# Patient Record
Sex: Female | Born: 1945 | Race: White | Hispanic: No | Smoking: Former smoker
Health system: Southern US, Community
[De-identification: ages and names within clinical notes are randomized; demographics above are authoritative.]

## PROBLEM LIST (undated history)

## (undated) DIAGNOSIS — R413 Other amnesia: Secondary | ICD-10-CM

## (undated) DIAGNOSIS — M810 Age-related osteoporosis without current pathological fracture: Secondary | ICD-10-CM

## (undated) DIAGNOSIS — Z9289 Personal history of other medical treatment: Secondary | ICD-10-CM

## (undated) DIAGNOSIS — R0602 Shortness of breath: Secondary | ICD-10-CM

## (undated) DIAGNOSIS — J449 Chronic obstructive pulmonary disease, unspecified: Secondary | ICD-10-CM

## (undated) DIAGNOSIS — E7800 Pure hypercholesterolemia, unspecified: Secondary | ICD-10-CM

## (undated) DIAGNOSIS — F17200 Nicotine dependence, unspecified, uncomplicated: Secondary | ICD-10-CM

## (undated) DIAGNOSIS — M1990 Unspecified osteoarthritis, unspecified site: Secondary | ICD-10-CM

## (undated) DIAGNOSIS — T8859XA Other complications of anesthesia, initial encounter: Secondary | ICD-10-CM

## (undated) DIAGNOSIS — Z923 Personal history of irradiation: Secondary | ICD-10-CM

## (undated) HISTORY — PX: ABDOMINAL HYSTERECTOMY: SHX81

## (undated) HISTORY — DX: Personal history of irradiation: Z92.3

## (undated) HISTORY — PX: EYE SURGERY: SHX253

## (undated) HISTORY — PX: APPENDECTOMY: SHX54

## (undated) HISTORY — PX: COLONOSCOPY W/ POLYPECTOMY: SHX1380

---

## 1999-01-30 ENCOUNTER — Ambulatory Visit (HOSPITAL_COMMUNITY): Admission: RE | Admit: 1999-01-30 | Discharge: 1999-01-30 | Payer: Self-pay | Admitting: Gastroenterology

## 2000-02-14 ENCOUNTER — Encounter: Payer: Self-pay | Admitting: Family Medicine

## 2000-02-14 ENCOUNTER — Encounter: Admission: RE | Admit: 2000-02-14 | Discharge: 2000-02-14 | Payer: Self-pay | Admitting: Family Medicine

## 2001-03-25 ENCOUNTER — Encounter: Admission: RE | Admit: 2001-03-25 | Discharge: 2001-03-25 | Payer: Self-pay | Admitting: Family Medicine

## 2001-03-25 ENCOUNTER — Encounter: Payer: Self-pay | Admitting: Family Medicine

## 2002-06-11 ENCOUNTER — Encounter: Payer: Self-pay | Admitting: Family Medicine

## 2002-06-11 ENCOUNTER — Encounter: Admission: RE | Admit: 2002-06-11 | Discharge: 2002-06-11 | Payer: Self-pay | Admitting: Family Medicine

## 2003-05-11 ENCOUNTER — Other Ambulatory Visit: Admission: RE | Admit: 2003-05-11 | Discharge: 2003-05-11 | Payer: Self-pay | Admitting: Family Medicine

## 2003-06-21 ENCOUNTER — Encounter: Payer: Self-pay | Admitting: Family Medicine

## 2003-06-21 ENCOUNTER — Encounter: Admission: RE | Admit: 2003-06-21 | Discharge: 2003-06-21 | Payer: Self-pay | Admitting: Family Medicine

## 2003-06-24 ENCOUNTER — Encounter: Admission: RE | Admit: 2003-06-24 | Discharge: 2003-06-24 | Payer: Self-pay | Admitting: Family Medicine

## 2003-06-24 ENCOUNTER — Encounter: Payer: Self-pay | Admitting: Family Medicine

## 2004-05-02 ENCOUNTER — Ambulatory Visit (HOSPITAL_COMMUNITY): Admission: RE | Admit: 2004-05-02 | Discharge: 2004-05-02 | Payer: Self-pay | Admitting: Gastroenterology

## 2004-05-02 ENCOUNTER — Encounter (INDEPENDENT_AMBULATORY_CARE_PROVIDER_SITE_OTHER): Payer: Self-pay | Admitting: *Deleted

## 2004-10-31 ENCOUNTER — Encounter: Admission: RE | Admit: 2004-10-31 | Discharge: 2004-10-31 | Payer: Self-pay | Admitting: Family Medicine

## 2006-01-09 ENCOUNTER — Encounter: Admission: RE | Admit: 2006-01-09 | Discharge: 2006-01-09 | Payer: Self-pay | Admitting: Family Medicine

## 2007-02-20 ENCOUNTER — Encounter: Admission: RE | Admit: 2007-02-20 | Discharge: 2007-02-20 | Payer: Self-pay | Admitting: Family Medicine

## 2007-03-31 ENCOUNTER — Encounter: Admission: RE | Admit: 2007-03-31 | Discharge: 2007-03-31 | Payer: Self-pay | Admitting: Family Medicine

## 2008-07-28 ENCOUNTER — Encounter: Admission: RE | Admit: 2008-07-28 | Discharge: 2008-07-28 | Payer: Self-pay | Admitting: Family Medicine

## 2009-05-30 ENCOUNTER — Encounter: Admission: RE | Admit: 2009-05-30 | Discharge: 2009-05-30 | Payer: Self-pay | Admitting: Family Medicine

## 2010-03-16 ENCOUNTER — Encounter: Admission: RE | Admit: 2010-03-16 | Discharge: 2010-03-16 | Payer: Self-pay | Admitting: Family Medicine

## 2010-09-10 ENCOUNTER — Encounter: Admission: RE | Admit: 2010-09-10 | Discharge: 2010-09-10 | Payer: Self-pay | Admitting: Family Medicine

## 2011-03-29 NOTE — Op Note (Signed)
NAMEWING, GFELLER                          ACCOUNT NO.:  [id number]   MEDICAL RECORD NO.:  [id number]                   PATIENT TYPE:  AMB   LOCATION:  ENDO                                 FACILITY:  [facility]   PHYSICIAN:  Petra Kuba, M.D.                 DATE OF BIRTH:  1946/03/16   DATE OF PROCEDURE:  05/02/2004  DATE OF DISCHARGE:                                 OPERATIVE REPORT   PROCEDURE:  Colonoscopy with polypectomy.   INDICATIONS FOR PROCEDURE:  A patient with a history of colon polyps due for  repeat screening.   Consent was signed after risks, benefits, methods, and options were  thoroughly discussed multiple times in the past.   MEDICINES USED:  Demerol 70, Versed 7.   DESCRIPTION OF PROCEDURE:  Rectal inspection was pertinent for small  external hemorrhoids. Digital exam was negative. The pediatric video  adjustable colonoscope was inserted and despite a tortous sigmoid with  rolling her on her back and abdominal pressure was able to be advanced to  the cecum.  No obvious abnormality was seen on insertion. The cecum was  identified by the appendiceal orifice and the ileocecal valve, the scope was  slowly withdrawn. The prep was adequate. There was some liquid tool that  required washing and suctioning  but on slow withdrawal through the colon  other than a tiny rectal polyp which was hot biopsied on retroflexion and  some small internal hemorrhoids on retroflexion no abnormalities were seen.  Specifically no other polyps, tumors, masses or diverticula.  Anal rectal  pullthrough confirmed the hemorrhoids.  The scope was reinserted a short  ways up the left side of the colon, air and water were suctioned, scope  removed. The patient tolerated the procedure well. There was no obvious or  immediate complications.   ENDOSCOPIC DIAGNOSIS:  1. Internal and external hemorrhoids.  2. Tiny rectal polyp hot biopsied on retroflexion.  3. Tortous sigmoid.  4. Otherwise  within normal limits to the cecum.   PLAN:  Await pathology but probably recheck colon screening in five years.  Happy to see back sooner p.r.n. otherwise return care to Dr. Clarene Duke for the  customary health care maintenance to include yearly rectals and guaiacs.                                               Petra Kuba, M.D.    MEM/MEDQ  D:  05/02/2004  T:  05/02/2004  Job:  [id number]   cc:   Caryn Bee L. Little, M.D.  [address]  [place]  [place] [address]  Fax: [phone]

## 2011-04-10 ENCOUNTER — Other Ambulatory Visit: Payer: Self-pay | Admitting: Family Medicine

## 2011-04-10 DIAGNOSIS — Z1231 Encounter for screening mammogram for malignant neoplasm of breast: Secondary | ICD-10-CM

## 2011-04-17 ENCOUNTER — Ambulatory Visit
Admission: RE | Admit: 2011-04-17 | Discharge: 2011-04-17 | Disposition: A | Discharge status: Home / Self Care | Payer: Medicare Other | Source: Ambulatory Visit | Attending: Family Medicine | Admitting: Family Medicine

## 2011-04-17 DIAGNOSIS — Z1231 Encounter for screening mammogram for malignant neoplasm of breast: Secondary | ICD-10-CM

## 2011-09-10 ENCOUNTER — Other Ambulatory Visit: Payer: Self-pay | Admitting: Family Medicine

## 2011-09-10 DIAGNOSIS — F17200 Nicotine dependence, unspecified, uncomplicated: Secondary | ICD-10-CM

## 2012-06-08 ENCOUNTER — Other Ambulatory Visit: Payer: Self-pay | Admitting: Family Medicine

## 2012-06-08 DIAGNOSIS — Z1231 Encounter for screening mammogram for malignant neoplasm of breast: Secondary | ICD-10-CM

## 2012-07-03 ENCOUNTER — Ambulatory Visit
Admission: RE | Admit: 2012-07-03 | Discharge: 2012-07-03 | Disposition: A | Discharge status: Home / Self Care | Payer: Medicare Other | Source: Ambulatory Visit | Attending: Family Medicine | Admitting: Family Medicine

## 2012-07-03 DIAGNOSIS — Z1231 Encounter for screening mammogram for malignant neoplasm of breast: Secondary | ICD-10-CM

## 2012-08-26 ENCOUNTER — Other Ambulatory Visit: Payer: Self-pay | Admitting: Neurosurgery

## 2012-08-26 DIAGNOSIS — IMO0002 Reserved for concepts with insufficient information to code with codable children: Secondary | ICD-10-CM

## 2012-09-02 ENCOUNTER — Ambulatory Visit
Admission: RE | Admit: 2012-09-02 | Discharge: 2012-09-02 | Disposition: A | Discharge status: Home / Self Care | Payer: Medicare Other | Source: Ambulatory Visit | Attending: Neurosurgery | Admitting: Neurosurgery

## 2012-09-02 DIAGNOSIS — IMO0002 Reserved for concepts with insufficient information to code with codable children: Secondary | ICD-10-CM

## 2012-09-09 ENCOUNTER — Other Ambulatory Visit: Payer: Self-pay | Admitting: Neurosurgery

## 2012-09-15 ENCOUNTER — Encounter (HOSPITAL_COMMUNITY): Payer: Self-pay | Admitting: Pharmacy Technician

## 2012-09-21 NOTE — Pre-Procedure Instructions (Signed)
20 Virginia Rose  09/21/2012   Your procedure is scheduled on:  Monday, November 18th  Report to [facility] at 0530 AM.  Call this number if you have problems the morning of surgery: [phone]   Remember:   Do not eat food or drink:After Midnight.   Take these medicines the morning of surgery with A SIP OF WATER: eye drops   Do not wear jewelry, make-up or nail polish.  Do not wear lotions, powders, or perfumes.    Do not shave 48 hours prior to surgery.    Do not bring valuables to the hospital.  Contacts, dentures or bridgework may not be worn into surgery.  Leave suitcase in the car. After surgery it may be brought to your room.  For patients admitted to the hospital, checkout time is 11:00 AM the day of discharge.    Special Instructions: Shower using CHG 2 nights before surgery and the night before surgery.  If you shower the day of surgery use CHG.  Use special wash - you have one bottle of CHG for all showers.  You should use approximately 1/3 of the bottle for each shower.   Please read over the following fact sheets that you were given: Pain Booklet, Coughing and Deep Breathing, Blood Transfusion Information, MRSA Information and Surgical Site Infection Prevention

## 2012-09-22 ENCOUNTER — Encounter (HOSPITAL_COMMUNITY): Payer: Self-pay

## 2012-09-22 ENCOUNTER — Encounter (HOSPITAL_COMMUNITY)
Admission: RE | Admit: 2012-09-22 | Discharge: 2012-09-22 | Disposition: A | Discharge status: Home / Self Care | Payer: Medicare Other | Source: Ambulatory Visit | Attending: Neurosurgery | Admitting: Neurosurgery

## 2012-09-22 HISTORY — DX: Chronic obstructive pulmonary disease, unspecified: J44.9

## 2012-09-22 HISTORY — DX: Pure hypercholesterolemia, unspecified: E78.00

## 2012-09-22 HISTORY — DX: Age-related osteoporosis without current pathological fracture: M81.0

## 2012-09-22 HISTORY — DX: Unspecified osteoarthritis, unspecified site: M19.90

## 2012-09-22 LAB — CBC
HCT: 40.1 % (ref 36.0–46.0)
Hemoglobin: 13.8 g/dL (ref 12.0–15.0)
MCH: 32.5 pg (ref 26.0–34.0)
MCHC: 34.4 g/dL (ref 30.0–36.0)
MCV: 94.6 fL (ref 78.0–100.0)
Platelets: 219 10*3/uL (ref 150–400)
RBC: 4.24 MIL/uL (ref 3.87–5.11)
RDW: 12.6 % (ref 11.5–15.5)
WBC: 5.7 10*3/uL (ref 4.0–10.5)

## 2012-09-22 LAB — SURGICAL PCR SCREEN
MRSA, PCR: NEGATIVE
Staphylococcus aureus: NEGATIVE

## 2012-09-22 LAB — ABO/RH: ABO/RH(D): O POS

## 2012-09-22 LAB — TYPE AND SCREEN
ABO/RH(D): O POS
Antibody Screen: NEGATIVE

## 2012-09-22 NOTE — Progress Notes (Signed)
Patient denied having a stress test, cardiac cath, or sleep study. Patient also denied having any lung disease, cough,  Wheezing, or shortness of breath. PCP is Dr. Clarene Duke and patient stated Dr. Clarene Duke she had COPD. Will request old chest xray and LOV from office and have Revonda Standard, [place] review chart to ensure that patient does not need a Xray.

## 2012-09-23 NOTE — Consult Note (Signed)
Anesthesia chart review: Patient is a 66 year old female scheduled for L4-5 PLIF by Dr. Wynetta Emery on 09/28/2012. History noted and includes COPD and smoking. She does not have a documented history of DM, CAD, or HTN.  PCP is Dr. Catha Gosselin.  He saw her on 09/15/12 for her annual CPE.  She had a relatively normal CMET at that time (Cr 0.70,  fasting glucose 101).  BP at her PAT appointment was 134/72.   Based on her current diagnoses, she did not meet anesthesia's requirements for pre-operative EKG or BMET.  (As above, she had a normal CMET earlier this month.)  According to Dr. Fredirick Maudlin note, he did want her to get a chest x-ray either during her hospitalization or at her next visit. Her last chest x-ray was greater than one year ago. With her history of COPD and smoking, I will plan to repeat a chest x-ray on the day of surgery.   Shonna Chock, PA-C

## 2012-09-27 MED ORDER — DEXAMETHASONE SODIUM PHOSPHATE 10 MG/ML IJ SOLN
10.0000 mg | INTRAMUSCULAR | Status: DC
  Filled 2012-09-27: qty 1

## 2012-09-27 MED ORDER — CEFAZOLIN SODIUM-DEXTROSE 2-3 GM-% IV SOLR
2.0000 g | INTRAVENOUS | Status: AC
  Administered 2012-09-28: 2 g via INTRAVENOUS
  Filled 2012-09-27: qty 50

## 2012-09-28 ENCOUNTER — Ambulatory Visit (HOSPITAL_COMMUNITY): Payer: Medicare Other

## 2012-09-28 ENCOUNTER — Encounter (HOSPITAL_COMMUNITY)
Admission: RE | Disposition: A | Discharge status: Home / Self Care | Payer: Self-pay | Source: Ambulatory Visit | Attending: Neurosurgery

## 2012-09-28 ENCOUNTER — Encounter (HOSPITAL_COMMUNITY): Payer: Self-pay | Admitting: Vascular Surgery

## 2012-09-28 ENCOUNTER — Encounter (HOSPITAL_COMMUNITY): Payer: Self-pay | Admitting: *Deleted

## 2012-09-28 ENCOUNTER — Ambulatory Visit (HOSPITAL_COMMUNITY): Payer: Medicare Other | Admitting: Vascular Surgery

## 2012-09-28 ENCOUNTER — Inpatient Hospital Stay (HOSPITAL_COMMUNITY)
Admission: RE | Admit: 2012-09-28 | Discharge: 2012-09-30 | DRG: 460 | Disposition: A | Discharge status: Home / Self Care | Payer: Medicare Other | Source: Ambulatory Visit | Attending: Neurosurgery | Admitting: Neurosurgery

## 2012-09-28 DIAGNOSIS — Z7982 Long term (current) use of aspirin: Secondary | ICD-10-CM

## 2012-09-28 DIAGNOSIS — F17200 Nicotine dependence, unspecified, uncomplicated: Secondary | ICD-10-CM | POA: Diagnosis present

## 2012-09-28 DIAGNOSIS — Q762 Congenital spondylolisthesis: Principal | ICD-10-CM

## 2012-09-28 DIAGNOSIS — J9811 Atelectasis: Secondary | ICD-10-CM

## 2012-09-28 DIAGNOSIS — E7800 Pure hypercholesterolemia, unspecified: Secondary | ICD-10-CM | POA: Diagnosis present

## 2012-09-28 DIAGNOSIS — Z01812 Encounter for preprocedural laboratory examination: Secondary | ICD-10-CM

## 2012-09-28 DIAGNOSIS — M7130 Other bursal cyst, unspecified site: Secondary | ICD-10-CM | POA: Diagnosis present

## 2012-09-28 DIAGNOSIS — J4489 Other specified chronic obstructive pulmonary disease: Secondary | ICD-10-CM | POA: Diagnosis present

## 2012-09-28 DIAGNOSIS — M129 Arthropathy, unspecified: Secondary | ICD-10-CM | POA: Diagnosis present

## 2012-09-28 DIAGNOSIS — M810 Age-related osteoporosis without current pathological fracture: Secondary | ICD-10-CM | POA: Diagnosis present

## 2012-09-28 DIAGNOSIS — Z79899 Other long term (current) drug therapy: Secondary | ICD-10-CM

## 2012-09-28 DIAGNOSIS — J449 Chronic obstructive pulmonary disease, unspecified: Secondary | ICD-10-CM | POA: Diagnosis present

## 2012-09-28 SURGERY — POSTERIOR LUMBAR FUSION 1 LEVEL
Anesthesia: General | Site: Back | Wound class: Clean

## 2012-09-28 MED ORDER — CYCLOBENZAPRINE HCL 10 MG PO TABS
10.0000 mg | ORAL_TABLET | Freq: Three times a day (TID) | ORAL | Status: DC | PRN
  Administered 2012-09-29 (×2): 10 mg via ORAL
  Filled 2012-09-28 (×2): qty 1

## 2012-09-28 MED ORDER — ONDANSETRON HCL 4 MG/2ML IJ SOLN: 4.0000 mg | INTRAMUSCULAR | Status: DC | PRN

## 2012-09-28 MED ORDER — ROCURONIUM BROMIDE 100 MG/10ML IV SOLN
INTRAVENOUS | Status: DC | PRN
  Administered 2012-09-28: 50 mg via INTRAVENOUS

## 2012-09-28 MED ORDER — THROMBIN 20000 UNITS EX SOLR
CUTANEOUS | Status: DC | PRN
  Administered 2012-09-28: 07:00:00 via TOPICAL

## 2012-09-28 MED ORDER — SODIUM CHLORIDE 0.9 % IR SOLN
Status: DC | PRN
  Administered 2012-09-28: 07:00:00

## 2012-09-28 MED ORDER — HYPROMELLOSE (GONIOSCOPIC) 2.5 % OP SOLN
1.0000 [drp] | Freq: Every day | OPHTHALMIC | Status: DC
  Administered 2012-09-28 – 2012-09-30 (×3): 1 [drp] via OPHTHALMIC
  Filled 2012-09-28: qty 15

## 2012-09-28 MED ORDER — SODIUM CHLORIDE 0.9 % IV SOLN
INTRAVENOUS | Status: AC
  Filled 2012-09-28: qty 500

## 2012-09-28 MED ORDER — LIDOCAINE-EPINEPHRINE 1 %-1:100000 IJ SOLN
INTRAMUSCULAR | Status: DC | PRN
  Administered 2012-09-28: 10 mL

## 2012-09-28 MED ORDER — HYDROMORPHONE HCL PF 1 MG/ML IJ SOLN
INTRAMUSCULAR | Status: AC
  Filled 2012-09-28: qty 1

## 2012-09-28 MED ORDER — ACETAMINOPHEN 10 MG/ML IV SOLN
1000.0000 mg | Freq: Four times a day (QID) | INTRAVENOUS | Status: AC
  Administered 2012-09-28 (×3): 1000 mg via INTRAVENOUS
  Filled 2012-09-28 (×4): qty 100

## 2012-09-28 MED ORDER — ACETAMINOPHEN 10 MG/ML IV SOLN: 1000.0000 mg | Freq: Once | INTRAVENOUS | Status: DC

## 2012-09-28 MED ORDER — 0.9 % SODIUM CHLORIDE (POUR BTL) OPTIME
TOPICAL | Status: DC | PRN
  Administered 2012-09-28: 1000 mL

## 2012-09-28 MED ORDER — OXYCODONE HCL 5 MG/5ML PO SOLN: 5.0000 mg | Freq: Once | ORAL | Status: DC | PRN

## 2012-09-28 MED ORDER — SODIUM CHLORIDE 0.9 % IJ SOLN
3.0000 mL | Freq: Two times a day (BID) | INTRAMUSCULAR | Status: DC
  Administered 2012-09-29 – 2012-09-30 (×2): 3 mL via INTRAVENOUS

## 2012-09-28 MED ORDER — VECURONIUM BROMIDE 10 MG IV SOLR
INTRAVENOUS | Status: DC | PRN
  Administered 2012-09-28: 2 mg via INTRAVENOUS

## 2012-09-28 MED ORDER — HEMOSTATIC AGENTS (NO CHARGE) OPTIME
TOPICAL | Status: DC | PRN
  Administered 2012-09-28: 1 via TOPICAL

## 2012-09-28 MED ORDER — PHENOL 1.4 % MT LIQD: 1.0000 | OROMUCOSAL | Status: DC | PRN

## 2012-09-28 MED ORDER — LIDOCAINE HCL (CARDIAC) 20 MG/ML IV SOLN
INTRAVENOUS | Status: DC | PRN
  Administered 2012-09-28: 100 mg via INTRAVENOUS

## 2012-09-28 MED ORDER — BACITRACIN 50000 UNITS IM SOLR
INTRAMUSCULAR | Status: AC
  Filled 2012-09-28: qty 1

## 2012-09-28 MED ORDER — ONDANSETRON HCL 4 MG/2ML IJ SOLN
INTRAMUSCULAR | Status: DC | PRN
  Administered 2012-09-28: 4 mg via INTRAVENOUS

## 2012-09-28 MED ORDER — FENTANYL CITRATE 0.05 MG/ML IJ SOLN
INTRAMUSCULAR | Status: DC | PRN
  Administered 2012-09-28: 50 ug via INTRAVENOUS
  Administered 2012-09-28: 100 ug via INTRAVENOUS
  Administered 2012-09-28 (×2): 50 ug via INTRAVENOUS

## 2012-09-28 MED ORDER — SIMVASTATIN 40 MG PO TABS
40.0000 mg | ORAL_TABLET | Freq: Every evening | ORAL | Status: DC
  Administered 2012-09-28 – 2012-09-29 (×2): 40 mg via ORAL
  Filled 2012-09-28 (×3): qty 1

## 2012-09-28 MED ORDER — HYDROMORPHONE HCL PF 1 MG/ML IJ SOLN: 0.5000 mg | INTRAMUSCULAR | Status: DC | PRN

## 2012-09-28 MED ORDER — ACETAMINOPHEN 325 MG PO TABS: 650.0000 mg | ORAL_TABLET | ORAL | Status: DC | PRN

## 2012-09-28 MED ORDER — PHENYLEPHRINE HCL 10 MG/ML IJ SOLN
INTRAMUSCULAR | Status: DC | PRN
  Administered 2012-09-28: 40 ug via INTRAVENOUS
  Administered 2012-09-28 (×2): 80 ug via INTRAVENOUS
  Administered 2012-09-28: 40 ug via INTRAVENOUS

## 2012-09-28 MED ORDER — SODIUM CHLORIDE 0.9 % IJ SOLN: 3.0000 mL | INTRAMUSCULAR | Status: DC | PRN

## 2012-09-28 MED ORDER — HYDROMORPHONE HCL PF 1 MG/ML IJ SOLN
0.2500 mg | INTRAMUSCULAR | Status: DC | PRN
Start: 2012-09-28 — End: 2012-09-28
  Administered 2012-09-28 (×2): 0.5 mg via INTRAVENOUS

## 2012-09-28 MED ORDER — ASPIRIN EC 81 MG PO TBEC
81.0000 mg | DELAYED_RELEASE_TABLET | Freq: Every day | ORAL | Status: DC
  Administered 2012-09-29 – 2012-09-30 (×2): 81 mg via ORAL
  Filled 2012-09-28 (×3): qty 1

## 2012-09-28 MED ORDER — ONDANSETRON HCL 4 MG/2ML IJ SOLN: 4.0000 mg | Freq: Four times a day (QID) | INTRAMUSCULAR | Status: DC | PRN

## 2012-09-28 MED ORDER — MENTHOL 3 MG MT LOZG: 1.0000 | LOZENGE | OROMUCOSAL | Status: DC | PRN

## 2012-09-28 MED ORDER — LACTATED RINGERS IV SOLN
INTRAVENOUS | Status: DC | PRN
  Administered 2012-09-28 (×2): via INTRAVENOUS

## 2012-09-28 MED ORDER — ALENDRONATE SODIUM 70 MG PO TABS
70.0000 mg | ORAL_TABLET | ORAL | Status: DC
Start: 2012-09-28 — End: 2012-09-28

## 2012-09-28 MED ORDER — SODIUM CHLORIDE 0.9 % IV SOLN: 250.0000 mL | INTRAVENOUS | Status: DC

## 2012-09-28 MED ORDER — ASPIRIN 81 MG PO TABS
81.0000 mg | ORAL_TABLET | Freq: Every day | ORAL | Status: DC
Start: 2012-09-28 — End: 2012-09-28

## 2012-09-28 MED ORDER — MIDAZOLAM HCL 5 MG/5ML IJ SOLN
INTRAMUSCULAR | Status: DC | PRN
  Administered 2012-09-28: 2 mg via INTRAVENOUS

## 2012-09-28 MED ORDER — OXYCODONE HCL 5 MG PO TABS: 5.0000 mg | ORAL_TABLET | Freq: Once | ORAL | Status: DC | PRN

## 2012-09-28 MED ORDER — PROPOFOL 10 MG/ML IV BOLUS
INTRAVENOUS | Status: DC | PRN
  Administered 2012-09-28: 70 mg via INTRAVENOUS

## 2012-09-28 MED ORDER — OXYCODONE-ACETAMINOPHEN 5-325 MG PO TABS
1.0000 | ORAL_TABLET | ORAL | Status: DC | PRN
  Administered 2012-09-28: 1 via ORAL
  Administered 2012-09-28: 2 via ORAL
  Administered 2012-09-29: 1 via ORAL
  Administered 2012-09-29 – 2012-09-30 (×2): 2 via ORAL
  Filled 2012-09-28 (×5): qty 2

## 2012-09-28 MED ORDER — ACETAMINOPHEN 650 MG RE SUPP: 650.0000 mg | RECTAL | Status: DC | PRN

## 2012-09-28 MED ORDER — ALUM & MAG HYDROXIDE-SIMETH 200-200-20 MG/5ML PO SUSP: 30.0000 mL | Freq: Four times a day (QID) | ORAL | Status: DC | PRN

## 2012-09-28 MED ORDER — ACETAMINOPHEN 10 MG/ML IV SOLN
INTRAVENOUS | Status: DC | PRN
  Administered 2012-09-28: 1000 mg via INTRAVENOUS

## 2012-09-28 MED ORDER — CEFAZOLIN SODIUM 1-5 GM-% IV SOLN
1.0000 g | Freq: Three times a day (TID) | INTRAVENOUS | Status: AC
  Administered 2012-09-28 (×2): 1 g via INTRAVENOUS
  Filled 2012-09-28 (×2): qty 50

## 2012-09-28 MED ORDER — POLYVINYL ALCOHOL 1.4 % OP SOLN
1.0000 [drp] | OPHTHALMIC | Status: DC | PRN
  Administered 2012-09-30: 1 [drp] via OPHTHALMIC
  Filled 2012-09-28: qty 15

## 2012-09-28 SURGICAL SUPPLY — 75 items
ADH SKN CLS APL DERMABOND .7 (GAUZE/BANDAGES/DRESSINGS) ×1
APL SKNCLS STERI-STRIP NONHPOA (GAUZE/BANDAGES/DRESSINGS) ×1
BAG DECANTER FOR FLEXI CONT (MISCELLANEOUS) ×2 IMPLANT
BENZOIN TINCTURE PRP APPL 2/3 (GAUZE/BANDAGES/DRESSINGS) ×2 IMPLANT
BLADE SURG 11 STRL SS (BLADE) ×2 IMPLANT
BLADE SURG ROTATE 9660 (MISCELLANEOUS) IMPLANT
BRUSH SCRUB EZ PLAIN DRY (MISCELLANEOUS) ×2 IMPLANT
BUR MATCHSTICK NEURO 3.0 LAGG (BURR) ×2 IMPLANT
BUR PRECISION FLUTE 6.0 (BURR) ×2 IMPLANT
CANISTER SUCTION 2500CC (MISCELLANEOUS) ×2 IMPLANT
CAP LOCKING REVERE (Cap) ×4 IMPLANT
CLOTH BEACON ORANGE TIMEOUT ST (SAFETY) ×2 IMPLANT
CONT SPEC 4OZ CLIKSEAL STRL BL (MISCELLANEOUS) ×4 IMPLANT
COVER BACK TABLE 24X17X13 BIG (DRAPES) IMPLANT
COVER TABLE BACK 60X90 (DRAPES) ×2 IMPLANT
DECANTER SPIKE VIAL GLASS SM (MISCELLANEOUS) ×2 IMPLANT
DERMABOND ADVANCED (GAUZE/BANDAGES/DRESSINGS) ×1
DERMABOND ADVANCED .7 DNX12 (GAUZE/BANDAGES/DRESSINGS) ×1 IMPLANT
DRAPE C-ARM 42X72 X-RAY (DRAPES) ×4 IMPLANT
DRAPE LAPAROTOMY 100X72X124 (DRAPES) ×2 IMPLANT
DRAPE POUCH INSTRU U-SHP 10X18 (DRAPES) ×2 IMPLANT
DRAPE PROXIMA HALF (DRAPES) IMPLANT
DRAPE SURG 17X23 STRL (DRAPES) ×2 IMPLANT
DRSG OPSITE 4X5.5 SM (GAUZE/BANDAGES/DRESSINGS) ×3 IMPLANT
ELECT CAUTERY BLADE 6.4 (BLADE) ×1 IMPLANT
ELECT REM PT RETURN 9FT ADLT (ELECTROSURGICAL) ×2
ELECTRODE REM PT RTRN 9FT ADLT (ELECTROSURGICAL) ×1 IMPLANT
EVACUATOR 3/16  PVC DRAIN (DRAIN) ×1
EVACUATOR 3/16 PVC DRAIN (DRAIN) ×1 IMPLANT
GAUZE SPONGE 4X4 16PLY XRAY LF (GAUZE/BANDAGES/DRESSINGS) IMPLANT
GLOVE BIO SURGEON STRL SZ8 (GLOVE) ×4 IMPLANT
GLOVE BIO SURGEON STRL SZ8.5 (GLOVE) ×1 IMPLANT
GLOVE BIOGEL PI IND STRL 8 (GLOVE) IMPLANT
GLOVE BIOGEL PI INDICATOR 8 (GLOVE) ×2
GLOVE ECLIPSE 7.5 STRL STRAW (GLOVE) IMPLANT
GLOVE EXAM NITRILE LRG STRL (GLOVE) IMPLANT
GLOVE EXAM NITRILE MD LF STRL (GLOVE) ×1 IMPLANT
GLOVE EXAM NITRILE XL STR (GLOVE) IMPLANT
GLOVE EXAM NITRILE XS STR PU (GLOVE) IMPLANT
GLOVE INDICATOR 8.0 STRL GRN (GLOVE) ×1 IMPLANT
GLOVE INDICATOR 8.5 STRL (GLOVE) ×5 IMPLANT
GLOVE SS BIOGEL STRL SZ 6.5 (GLOVE) IMPLANT
GLOVE SS BIOGEL STRL SZ 8 (GLOVE) IMPLANT
GLOVE SUPERSENSE BIOGEL SZ 6.5 (GLOVE) ×1
GLOVE SUPERSENSE BIOGEL SZ 8 (GLOVE) ×1
GOWN BRE IMP SLV AUR LG STRL (GOWN DISPOSABLE) IMPLANT
GOWN BRE IMP SLV AUR XL STRL (GOWN DISPOSABLE) ×5 IMPLANT
GOWN STRL REIN 2XL LVL4 (GOWN DISPOSABLE) ×1 IMPLANT
KIT BASIN OR (CUSTOM PROCEDURE TRAY) ×2 IMPLANT
KIT ROOM TURNOVER OR (KITS) ×2 IMPLANT
MILL MEDIUM DISP (BLADE) ×1 IMPLANT
NDL HYPO 25X1 1.5 SAFETY (NEEDLE) ×1 IMPLANT
NEEDLE HYPO 25X1 1.5 SAFETY (NEEDLE) ×2 IMPLANT
NS IRRIG 1000ML POUR BTL (IV SOLUTION) ×2 IMPLANT
PACK LAMINECTOMY NEURO (CUSTOM PROCEDURE TRAY) ×2 IMPLANT
PAD ARMBOARD 7.5X6 YLW CONV (MISCELLANEOUS) ×6 IMPLANT
PENCIL FOOT CONTROL (ELECTRODE) ×2 IMPLANT
PUTTY BONE DBX 5CC MIX (Putty) ×1 IMPLANT
ROD CURVED 5.5X40MM (Rod) ×1 IMPLANT
ROD CURVED 5.5X45MM (Rod) ×1 IMPLANT
SCREW PEDICLE 6.5X40MM (Screw) ×4 IMPLANT
SPACER CALIBER 10X22MM 11-15MM (Spacer) ×2 IMPLANT
SPONGE GAUZE 4X4 12PLY (GAUZE/BANDAGES/DRESSINGS) ×2 IMPLANT
SPONGE LAP 4X18 X RAY DECT (DISPOSABLE) IMPLANT
SPONGE SURGIFOAM ABS GEL 100 (HEMOSTASIS) ×2 IMPLANT
STRIP CLOSURE SKIN 1/2X4 (GAUZE/BANDAGES/DRESSINGS) ×3 IMPLANT
SUT VIC AB 0 CT1 18XCR BRD8 (SUTURE) ×2 IMPLANT
SUT VIC AB 0 CT1 8-18 (SUTURE) ×2
SUT VIC AB 2-0 CT1 18 (SUTURE) ×2 IMPLANT
SUT VICRYL 4-0 PS2 18IN ABS (SUTURE) ×2 IMPLANT
SYR 20ML ECCENTRIC (SYRINGE) ×2 IMPLANT
TOWEL OR 17X24 6PK STRL BLUE (TOWEL DISPOSABLE) ×2 IMPLANT
TOWEL OR 17X26 10 PK STRL BLUE (TOWEL DISPOSABLE) ×2 IMPLANT
TRAY FOLEY CATH 14FRSI W/METER (CATHETERS) ×2 IMPLANT
WATER STERILE IRR 1000ML POUR (IV SOLUTION) ×2 IMPLANT

## 2012-09-28 NOTE — Transfer of Care (Signed)
Immediate Anesthesia Transfer of Care Note  Patient: Virginia Rose  Procedure(s) Performed: Procedure(s) (LRB) with comments: POSTERIOR LUMBAR FUSION 1 LEVEL (N/A) - POSTERIOR LUMBAR FOUR-FIVE INTERBODY FUSION WITH PEDICLE SCREWS  Patient Location: PACU  Anesthesia Type:General  Level of Consciousness: awake, alert  and oriented  Airway & Oxygen Therapy: Patient Spontanous Breathing and Patient connected to nasal cannula oxygen  Post-op Assessment: Report given to PACU RN, Post -op Vital signs reviewed and stable and Patient moving all extremities  Post vital signs: Reviewed and stable  Complications: No apparent anesthesia complications

## 2012-09-28 NOTE — Anesthesia Preprocedure Evaluation (Signed)
Anesthesia Evaluation  Patient identified by MRN, date of birth, ID band Patient awake    Reviewed: Allergy & Precautions, H&P , NPO status , Patient's Chart, lab work & pertinent test results  Airway Mallampati: II  Neck ROM: full    Dental   Pulmonary COPDCurrent Smoker,          Cardiovascular     Neuro/Psych    GI/Hepatic   Endo/Other    Renal/GU      Musculoskeletal  (+) Arthritis -,   Abdominal   Peds  Hematology   Anesthesia Other Findings   Reproductive/Obstetrics                           Anesthesia Physical Anesthesia Plan  ASA: II  Anesthesia Plan: General   Post-op Pain Management:    Induction: Intravenous  Airway Management Planned: Oral ETT  Additional Equipment:   Intra-op Plan:   Post-operative Plan: Extubation in OR  Informed Consent: I have reviewed the patients History and Physical, chart, labs and discussed the procedure including the risks, benefits and alternatives for the proposed anesthesia with the patient or authorized representative who has indicated his/her understanding and acceptance.     Plan Discussed with: CRNA and Surgeon  Anesthesia Plan Comments:         Anesthesia Quick Evaluation

## 2012-09-28 NOTE — Anesthesia Procedure Notes (Signed)
Procedure Name: Intubation Date/Time: 09/28/2012 7:37 AM Performed by: Rossie Muskrat L Pre-anesthesia Checklist: Patient identified, Timeout performed, Emergency Drugs available, Suction available and Patient being monitored Patient Re-evaluated:Patient Re-evaluated prior to inductionOxygen Delivery Method: Circle system utilized Preoxygenation: Pre-oxygenation with 100% oxygen Intubation Type: IV induction Ventilation: Mask ventilation without difficulty Laryngoscope Size: Miller and 2 Grade View: Grade I Tube type: Oral Tube size: 7.0 mm Number of attempts: 1 Airway Equipment and Method: Stylet Placement Confirmation: ETT inserted through vocal cords under direct vision and breath sounds checked- equal and bilateral Secured at: 20 cm Tube secured with: Tape Dental Injury: Teeth and Oropharynx as per pre-operative assessment

## 2012-09-28 NOTE — Op Note (Signed)
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  EBL: Minimal  History of present illness: Patient is very pleasant 60 of them is a progress worsening back and bilateral leg pain that's gone for several years but acutely worse in the last several months workup has shown a progression of her lumbar spinal stenosis with development of both an intra-canal synovial cyst as well as an x-ray facet synovial cyst at L4-5 with marked stenosis of both the L4 and L5 nerve roots bilaterally patient failed all forms of conservative treatment imaging findings and progression of clinical syndrome she was recommended decompression stabilization procedure 1 of the wrist nevus of the operation with her as well as perioperative course and expectations of outcome alternatives of surgery and she understands and agrees to proceed forward.  Operative procedure: Patient brought into the or was induced under general anesthesia spell Wilson frame her back was prepped and draped in sterile fashion after infiltration 10 cc lidocaine with epi a midline incision made and subperiosteal dissection was carried out exposing the L4 and L5 TPC L4 and L5 spinous process and the disc space level interoperative X. identify the L4 pedicle so self-retaining retractors placed spinous process was removed at L4 so decompression was begun  complete medial facetectomies were performed there was marked facet arthropathy and diastased of the facet joints and bilateral synovial cyst this level after the complete medial facetectomies were performed aggressive abutting of the superior tickling facet a both sides was aggressively under bitten exposing the disc space and disk space epidural veins are coagulated marked epidural fibrosis also was creating a large inflammatory response impression the undersurface of the 4 roots bilaterally from the synovial cyst this is all teased away and removed decompress in the both nerve roots and decompress the central canal the at this point attention second the pedicle to placement using a high-speed drill pilot holes were drilled L4 Timothy awl probed P. Probed again a 6 x 40 screw inserted L4 and the right. Fluoroscopy used the step along to confirm Deppen trajectory in a similar fashion the L5 screws placed in the right as well as L4-5 on the left. After all 4 screws and placed and position confirmed with fluoroscopy to the interbody work the spaces cleanout a size 12 distractor was inserted this had good apposition the endplates of the proper size for the implants Cindy spaces and cleanout the left with the rongeurs a size 11 rotating cutter downgoing Epstein curettes and pituitaries. After adequate endplate preparation been achieved an 11 mm x 15 by 22 mm expandable caliber cage packed with local autograft mixed DBX and inserted this was then I opened up 5 turns which approximately 14 mm. In the distractors removed fluoroscopy confirmed good position of the implant less and the opposite side the spaces and cleanout central disc was scraped away and removed at 6] the endplates both centrally and laterally local Arthrex packed centrally the opposite cages and placed and expanded  a similar height. After all interbody work been done to stick and aggressive decortication TPS bilaterally local graft pack posterior  laterally then 9 the 2 rods were placed top tightness tightened down at both levels clamping the rods all the foraminal reinspected confirm no measurement migration of graft material after all the foramina were confirmed patent the wound scope was irrigated a large ureter was placed the wounds closed in layers with after Vicryl and a running 4 septic or and skin benzoin Steri-Strips applied patient recovered in stable condition at the end of case on it counts and sponge counts were correct.

## 2012-09-28 NOTE — Progress Notes (Signed)
UR COMPLETED  

## 2012-09-28 NOTE — Preoperative (Signed)
Beta Blockers   Reason not to administer Beta Blockers:Not Applicable 

## 2012-09-28 NOTE — Anesthesia Postprocedure Evaluation (Signed)
Anesthesia Post Note  Patient: Virginia Rose  Procedure(s) Performed: Procedure(s) (LRB): POSTERIOR LUMBAR FUSION 1 LEVEL (N/A)  Anesthesia type: General  Patient location: PACU  Post pain: Pain level controlled and Adequate analgesia  Post assessment: Post-op Vital signs reviewed, Patient's Cardiovascular Status Stable, Respiratory Function Stable, Patent Airway and Pain level controlled  Last Vitals:  Filed Vitals:   09/28/12 1029  BP:   Pulse:   Temp: 36.6 C  Resp:     Post vital signs: Reviewed and stable  Level of consciousness: awake, alert  and oriented  Complications: No apparent anesthesia complications

## 2012-09-28 NOTE — H&P (Signed)
Virginia Rose is an 66 y.o. female.   Chief Complaint: Back and bilateral leg pain right worse than left HPI: Patient reports 66 year old female is a long-standing chronic low back pain and pain into both legs rating down the front of her shin top foot and big toe consistent with an L4 and L5 nerve root pattern. Imaging showed a grade 1 spinal listhesis with been managing this conservatively for the last several years over last few months scan acutely worse. She reports the pain will radiate predominantly in the left ligament morning but then as the day goes on but was more in the right leg she denies any bowel bladder complaints does have tingling and numbness in the same distribution she has gone through epidural steroid injections physical therapy with minimal success recently. Imaging revealed severe spinal stenosis and a spinal listhesis L4-5 due to failure conservative treatment progression clinical syndrome and imaging findings have recommended decompression stabilization procedure at that level I went over the risks and benefits of the operation with her as well as perioperative course and expectations of outcome and alternatives of surgery and she understands and agrees to proceed forward.  Past Medical History  Diagnosis Date  . COPD (chronic obstructive pulmonary disease)   . Hypercholesteremia   . Osteoporosis   . Arthritis     Past Surgical History  Procedure Date  . Abdominal hysterectomy   . Appendectomy   . Eye surgery     bilateral cataract removal  . Colonoscopy w/ polypectomy     History reviewed. No pertinent family history. Social History:  reports that she has been smoking Cigarettes.  She has a 24 pack-year smoking history. She does not have any smokeless tobacco history on file. She reports that she drinks alcohol. She reports that she does not use illicit drugs.  Allergies: No Known Allergies  Medications Prior to Admission  Medication Sig Dispense Refill  .  alendronate (FOSAMAX) 70 MG tablet Take 70 mg by mouth every 7 (seven) days. Take with a full glass of water on an empty stomach. Take on Mondays      . aspirin 81 MG tablet Take 81 mg by mouth daily.      . hydroxypropyl methylcellulose (ISOPTO TEARS) 2.5 % ophthalmic solution Place 1 drop into both eyes daily.      . simvastatin (ZOCOR) 40 MG tablet Take 40 mg by mouth every evening.        No results found for this or any previous visit (from the past 48 hour(s)). No results found.  Review of Systems  Constitutional: Negative.   HENT: Negative.   Eyes: Negative.   Respiratory: Negative.   Cardiovascular: Negative.   Gastrointestinal: Negative.   Genitourinary: Negative.   Musculoskeletal: Positive for myalgias, back pain and joint pain.  Skin: Negative.   Neurological: Positive for tingling and sensory change.    Blood pressure 123/73, pulse 75, temperature 97.8 F (36.6 C), temperature source Oral, resp. rate 18, SpO2 97.00%. Physical Exam  Constitutional: She is oriented to person, place, and time. She appears well-developed and well-nourished.  HENT:  Head: Normocephalic.  Eyes: Pupils are equal, round, and reactive to light.  Neck: Normal range of motion.  Cardiovascular: Normal rate and regular rhythm.   Respiratory: Effort normal and breath sounds normal.  GI: Soft.  Neurological: She is alert and oriented to person, place, and time. She has normal strength. GCS eye subscore is 4. GCS verbal subscore is 5. GCS motor subscore is  6.  Reflex Scores:      Tricep reflexes are 2+ on the right side and 2+ on the left side.      Bicep reflexes are 2+ on the right side and 2+ on the left side.      Brachioradialis reflexes are 2+ on the right side and 2+ on the left side.      Patellar reflexes are 0 on the right side and 0 on the left side.      Achilles reflexes are 0 on the right side and 0 on the left side.      Strength is 5 out of 5 in her iliopsoas, quads, hysteroscopic  gastrocs, anterior tibialis, and EHL.     Assessment/Plan 66 year female presents for an L4-5 decompression stabilization procedure.  Virginia Rose P 09/28/2012, 6:51 AM

## 2012-09-29 NOTE — Evaluation (Signed)
Physical Therapy Evaluation Patient Details Name: Virginia Rose MRN: [id number] DOB: 22-Jan-1946 Today's Date: 09/29/2012 Time: 2956-2130 PT Time Calculation (min): 24 min  PT Assessment / Plan / Recommendation Clinical Impression  Pt s/p PLF functioning at mod I level already. Patient with good home set up and family support 24/7. Pt does no need any DME and is able to don lumbar corset I'ly. Pt able to verbally and demonstrate understanding of 3/3 back precautions. Pt with no further skilled PT needs in hospital. Patient safe to d/c home when approved by MD. PT signing off, please re-consult if you feel skilled PT needs are needed again this stay.    PT Assessment  Patent does not need any further PT services    Follow Up Recommendations  No PT follow up;Supervision - Intermittent    Does the patient have the potential to tolerate intense rehabilitation      Barriers to Discharge        Equipment Recommendations  None recommended by PT    Recommendations for Other Services     Frequency      Precautions / Restrictions Precautions Precautions: Back Precaution Booklet Issued: Yes (comment) Precaution Comments: pt with verbal understanding Required Braces or Orthoses: Spinal Brace Spinal Brace: Applied in sitting position;Lumbar corset Restrictions Weight Bearing Restrictions: No   Pertinent Vitals/Pain 5/10 surgical back pain, denies numbness and tingling as well as radiating pain      Mobility  Bed Mobility Bed Mobility: Rolling Left;Left Sidelying to Sit Rolling Left: 6: Modified independent (Device/Increase time) Left Sidelying to Sit: 6: Modified independent (Device/Increase time);HOB flat (v/c's for hand placement without rail) Details for Bed Mobility Assistance: v/c's for log roll technique to adhere to back precautions Transfers Transfers: Sit to Stand;Stand to Sit Sit to Stand: 6: Modified independent (Device/Increase time);With upper extremity assist;From  bed Stand to Sit: 6: Modified independent (Device/Increase time);With upper extremity assist;To chair/3-in-1 Details for Transfer Assistance: safe technique Ambulation/Gait Ambulation/Gait Assistance: 5: Supervision Ambulation Distance (Feet): 300 Feet Assistive device: None Ambulation/Gait Assistance Details: pt with no episodes of LOB or dizziness Gait Pattern: Within Functional Limits Gait velocity: normal General Gait Details: mild onset of pressure but no increase in pain per patient report Stairs: No    Shoulder Instructions     Exercises     PT Diagnosis:    PT Problem List:   PT Treatment Interventions:     PT Goals Acute Rehab PT Goals PT Goal Formulation:  (N/A)  Visit Information  Last PT Received On: 09/29/12 Assistance Needed: +1    Subjective Data  Subjective: Pt received supine in bed with report "I've already walked the halls once this morning with the RN." Pt pleasant and agreeable to PT. Patient Stated Goal: home   Prior Functioning  Home Living Lives With: Alone Available Help at Discharge: Family;Available 24 hours/day (son and family friend available 24/7) Type of Home: House Home Access: Level entry Home Layout: One level Bathroom Shower/Tub: Engineer, manufacturing systems: Standard Home Adaptive Equipment: None Prior Function Level of Independence: Independent Able to Take Stairs?: Yes Driving: Yes Vocation: Retired Musician: No difficulties Dominant Hand: Right    Cognition  Overall Cognitive Status: Appears within functional limits for tasks assessed/performed Arousal/Alertness: Awake/alert Orientation Level: Appears intact for tasks assessed Behavior During Session: [facility] for tasks performed    Extremity/Trunk Assessment Right Upper Extremity Assessment RUE ROM/Strength/Tone: [facility] for tasks assessed Left Upper Extremity Assessment LUE ROM/Strength/Tone: [facility] for tasks assessed Right Lower Extremity  Assessment RLE  ROM/Strength/Tone: [facility] for tasks assessed Left Lower Extremity Assessment LLE ROM/Strength/Tone: [facility] for tasks assessed Trunk Assessment Trunk Assessment: Normal   Balance    End of Session PT - End of Session Equipment Utilized During Treatment: Gait belt;Back brace Activity Tolerance: Patient tolerated treatment well Patient left: in chair;with call bell/phone within reach Nurse Communication: Selena Batten, RN instructed MV:HQIONGEX status (pt cleared to be indep. in room, supervision for amb in hall)  GP     Marcene Brawn 09/29/2012, 9:05 AM  Lewis Shock, PT, DPT Pager #: [phone] Office #: [phone]

## 2012-09-29 NOTE — Progress Notes (Signed)
Subjective: Patient reports She's doing very well she has some soreness in her back incisional however her legs have no pain he feel a lot better than before she denies any new numbness or tingling  Objective: Vital signs in last 24 hours: Temp:  [97.4 F (36.3 C)-98.3 F (36.8 C)] 98.3 F (36.8 C) (11/19 0557) Pulse Rate:  [58-69] 65  (11/19 0557) Resp:  [7-21] 16  (11/19 0557) BP: (90-146)/(42-85) 102/48 mmHg (11/19 0557) SpO2:  [96 %-100 %] 96 % (11/19 0557)  Intake/Output from previous day: 11/18 0701 - 11/19 0700 In: 3025 [P.O.:800; I.V.:2000; IV Piggyback:100] Out: 2065 [Urine:1710; Drains:205; Blood:150] Intake/Output this shift:    Strength is 5 out of 5 wound is clean and dry  Lab Results: No results found for this basename: WBC:2,HGB:2,HCT:2,PLT:2 in the last 72 hours BMET No results found for this basename: NA:2,K:2,CL:2,CO2:2,GLUCOSE:2,BUN:2,CREATININE:2,CALCIUM:2 in the last 72 hours  Studies/Results: Dg Chest 2 View  09/28/2012  *RADIOLOGY REPORT*  Clinical Data: Preop.  CHEST - 2 VIEW  Comparison: 09/11/2011.  Findings: Trachea is aligned.  Heart size normal.  Thoracic aorta is calcified. Scarring and calcification at the left lung apex. Lungs are hyperinflated but otherwise clear.  No pleural fluid. Mild mid thoracic compression fracture is unchanged.  IMPRESSION: COPD without acute finding.   Original Report Authenticated By: Leanna Battles, M.D.    Dg Lumbar Spine 2-3 Views  09/28/2012  *RADIOLOGY REPORT*  Clinical Data: Back pain  DG C-ARM 1-60 MIN,LUMBAR SPINE - 2-3 VIEW  Comparison: Multiple priors  Findings: C-arm films document posterolateral and interbody fusion at L4-L5.  IMPRESSION: As above.   Original Report Authenticated By: Davonna Belling, M.D.    Dg C-arm 1-60 Min  09/28/2012  *RADIOLOGY REPORT*  Clinical Data: Back pain  DG C-ARM 1-60 MIN,LUMBAR SPINE - 2-3 VIEW  Comparison: Multiple priors  Findings: C-arm films document posterolateral and  interbody fusion at L4-L5.  IMPRESSION: As above.   Original Report Authenticated By: Davonna Belling, M.D.     Assessment/Plan: Progressive mobilization today with physical and occupational therapy possible discharge tomorrow  LOS: 1 day     Courtlynn Holloman P 09/29/2012, 9:35 AM

## 2012-09-29 NOTE — Progress Notes (Signed)
Pt voided several times today.

## 2012-09-29 NOTE — Evaluation (Signed)
Occupational Therapy Evaluation Patient Details Name: NAYELI CALVERT MRN: [id number] DOB: 15-Sep-1946 Today's Date: 09/29/2012 Time: 7829-5621 OT Time Calculation (min): 15 min  OT Assessment / Plan / Recommendation Clinical Impression  Pt presents to OT s/p back surgery. Pt able to recall and demonstrate ability to perform ADL activity with back precautions. Education complete. No further OT needed    OT Assessment  Patient does not need any further OT services          Equipment Recommendations  None recommended by OT          Precautions / Restrictions Precautions Precautions: Back Precaution Booklet Issued: Yes (comment) Precaution Comments: pt with verbal understanding Required Braces or Orthoses: Spinal Brace Spinal Brace: Applied in sitting position;Lumbar corset Restrictions Weight Bearing Restrictions: No       ADL  Grooming: Simulated;Independent Where Assessed - Grooming: Unsupported standing Upper Body Bathing: Simulated;Supervision/safety Where Assessed - Upper Body Bathing: Unsupported sitting Lower Body Bathing: Simulated;Supervision/safety Where Assessed - Lower Body Bathing: Unsupported sit to stand Upper Body Dressing: Performed;Independent;Other (comment) (brace) Where Assessed - Upper Body Dressing: Unsupported sitting Lower Body Dressing: Performed;Independent Where Assessed - Lower Body Dressing: Unsupported sit to stand Toilet Transfer: Performed;Independent Toilet Transfer Method: Sit to Barista: Comfort height toilet Toileting - Clothing Manipulation and Hygiene: Simulated;Independent Where Assessed - Toileting Clothing Manipulation and Hygiene: Standing Tub/Shower Transfer: Landscape architect Method: Ambulating ADL Comments: Pt able to recall and generalize back precautions to ADL actiivty including kitchen task and home management      Visit Information  Last OT Received On:  09/29/12       Prior Functioning     Home Living Lives With: Alone Available Help at Discharge: Family;Available 24 hours/day (son and family friend available 24/7) Type of Home: House Home Access: Level entry Home Layout: One level Bathroom Shower/Tub: Network engineer: None Prior Function Level of Independence: Independent Able to Take Stairs?: Yes Driving: Yes Vocation: Retired Musician: No difficulties Dominant Hand: Right            Cognition  Overall Cognitive Status: Appears within functional limits for tasks assessed/performed Arousal/Alertness: Awake/alert Orientation Level: Appears intact for tasks assessed Behavior During Session: [facility] for tasks performed    Extremity/Trunk Assessment Right Upper Extremity Assessment RUE ROM/Strength/Tone: [facility] for tasks assessed Left Upper Extremity Assessment LUE ROM/Strength/Tone: WFL for tasks assessed Right Lower Extremity Assessment RLE ROM/Strength/Tone: [facility] for tasks assessed Left Lower Extremity Assessment LLE ROM/Strength/Tone: WFL for tasks assessed Trunk Assessment Trunk Assessment: Normal     Mobility Bed Mobility Bed Mobility: Rolling Left;Left Sidelying to Sit Rolling Left: 6: Modified independent (Device/Increase time) Left Sidelying to Sit: 6: Modified independent (Device/Increase time);HOB flat (v/c's for hand placement without rail) Details for Bed Mobility Assistance: v/c's for log roll technique to adhere to back precautions Transfers Transfers: Sit to Stand;Stand to Sit Sit to Stand: With upper extremity assist;From bed;7: Independent Stand to Sit: With upper extremity assist;To chair/3-in-1;7: Independent              End of Session OT - End of Session Equipment Utilized During Treatment: Back brace Activity Tolerance: Patient tolerated treatment well Patient left: in bed  GO     Ernesto Zukowski, [place] Kung 09/29/2012,  3:15 PM

## 2012-09-30 MED ORDER — OXYCODONE-ACETAMINOPHEN 5-325 MG PO TABS: 1.0000 | ORAL_TABLET | ORAL | Status: DC | PRN

## 2012-09-30 MED ORDER — CYCLOBENZAPRINE HCL 10 MG PO TABS: 10.0000 mg | ORAL_TABLET | Freq: Three times a day (TID) | ORAL | Status: DC | PRN

## 2012-09-30 NOTE — Discharge Summary (Signed)
  Physician Discharge Summary  Patient ID: Virginia Rose MRN: [id number] DOB/AGE: [birth year] 66 y.o.  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 *    Discharged Condition: good  Hospital Course: Patient does hospital underwent the aforementioned procedure postoperatively patient did very well recovered in the floor on the floor patient, as well as emanating voiding spontaneously tolerating regular diet was stable to be discharged home on postop day 2. Patient discharged her to fall approximately one 2 weeks on oxycodone and Flexeril.  Consults: Significant Diagnostic Studies: Treatments: L4-5 posterior lumbar interbody fusion Discharge Exam: Blood pressure 101/45, pulse 75, temperature 99.6 F (37.6 C), temperature source Oral, resp. rate 20, height 5\' 3"  (1.6 m), weight 47.62 kg (104 lb 15.7 oz), SpO2 97.00%. Strength out of 5 wound clean and dry  Disposition: Home  Discharge Orders    Future Orders Please Complete By Expires   Nursing communication      Scheduling Instructions:   D/c with IS       Medication List     As of 09/30/2012  8:25 AM    TAKE these medications         alendronate 70 MG tablet   Commonly known as: FOSAMAX   Take 70 mg by mouth every 7 (seven) days. Take with a full glass of water on an empty stomach. Take on Mondays      aspirin 81 MG tablet   Take 81 mg by mouth daily.      cyclobenzaprine 10 MG tablet   Commonly known as: FLEXERIL   Take 1 tablet (10 mg total) by mouth 3 (three) times daily as needed for muscle spasms.      hydroxypropyl methylcellulose 2.5 % ophthalmic solution   Commonly known as: ISOPTO TEARS   Place 1 drop into both eyes daily.      oxyCODONE-acetaminophen 5-325 MG per tablet   Commonly known as:  PERCOCET/ROXICET   Take 1-2 tablets by mouth every 4 (four) hours as needed.      simvastatin 40 MG tablet   Commonly known as: ZOCOR   Take 40 mg by mouth every evening.         Signed: Ronold Hardgrove P 09/30/2012, 8:25 AM

## 2012-09-30 NOTE — Progress Notes (Signed)
Pt iv dc intact. Pt dc instructions provided. Pt verbalized understanding. rx given to patient and son. Pt under no s/s distress.

## 2012-09-30 NOTE — Progress Notes (Signed)
Subjective:2 Patient reports Patient is doing well no leg pain Robaxin as well controlled on pills  Objective: Vital signs in last 24 hours: Temp:  [98.7 F (37.1 C)-101 F (38.3 C)] 99.6 F (37.6 C) (11/20 0600) Pulse Rate:  [63-78] 75  (11/20 0600) Resp:  [18-20] 20  (11/20 0600) BP: (91-107)/(45-47) 101/45 mmHg (11/20 0600) SpO2:  [97 %-98 %] 97 % (11/20 0600) Weight:  [47.62 kg (104 lb 15.7 oz)] 47.62 kg (104 lb 15.7 oz) (11/19 2334)  Intake/Output from previous day: 11/19 0701 - 11/20 0700 In: -  Out: 85 [Drains:85] Intake/Output this shift:    Strength out of 5 wound clean and dry  Lab Results: No results found for this basename: WBC:2,HGB:2,HCT:2,PLT:2 in the last 72 hours BMET No results found for this basename: NA:2,K:2,CL:2,CO2:2,GLUCOSE:2,BUN:2,CREATININE:2,CALCIUM:2 in the last 72 hours  Studies/Results: Dg Lumbar Spine 2-3 Views  09/28/2012  *RADIOLOGY REPORT*  Clinical Data: Back pain  DG C-ARM 1-60 MIN,LUMBAR SPINE - 2-3 VIEW  Comparison: Multiple priors  Findings: C-arm films document posterolateral and interbody fusion at L4-L5.  IMPRESSION: As above.   Original Report Authenticated By: Davonna Belling, M.D.    Dg C-arm 1-60 Min  09/28/2012  *RADIOLOGY REPORT*  Clinical Data: Back pain  DG C-ARM 1-60 MIN,LUMBAR SPINE - 2-3 VIEW  Comparison: Multiple priors  Findings: C-arm films document posterolateral and interbody fusion at L4-L5.  IMPRESSION: As above.   Original Report Authenticated By: Davonna Belling, M.D.     Assessment/Plan: Discharge  LOS: 2 days     Musette Kisamore P 09/30/2012, 8:22 AM

## 2012-09-30 NOTE — Care Management Note (Signed)
    Page 1 of 1   09/30/2012     2:33:41 PM   CARE MANAGEMENT NOTE 09/30/2012  Patient:  Virginia Rose, Virginia Rose   Account Number:  [id number]  Date Initiated:  09/29/2012  Documentation initiated by:  [facility]  Subjective/Objective Assessment:   Decompressive lumbar laminectomy L4-5     Action/Plan:   no DME recommended or HH   Anticipated DC Date:  09/30/2012   Anticipated DC Plan:  HOME/SELF CARE      DC Planning Services  CM consult      Choice offered to / List presented to:             Status of service:  Completed, signed off Medicare Important Message given?   (If response is "NO", the following Medicare IM given date fields will be blank) Date Medicare IM given:   Date Additional Medicare IM given:    Discharge Disposition:  HOME/SELF CARE  Per UR Regulation:  Reviewed for med. necessity/level of care/duration of stay  If discussed at Long Length of Stay Meetings, dates discussed:    Comments:

## 2012-10-01 MED FILL — Sodium Chloride Irrigation Soln 0.9%: Qty: 3000 | Status: AC

## 2012-10-01 MED FILL — Sodium Chloride IV Soln 0.9%: INTRAVENOUS | Qty: 1000 | Status: AC

## 2013-08-24 ENCOUNTER — Other Ambulatory Visit: Payer: Self-pay

## 2013-08-24 DIAGNOSIS — Z1231 Encounter for screening mammogram for malignant neoplasm of breast: Secondary | ICD-10-CM

## 2013-09-09 ENCOUNTER — Ambulatory Visit
Admission: RE | Admit: 2013-09-09 | Discharge: 2013-09-09 | Disposition: A | Discharge status: Home / Self Care | Payer: Medicare Other | Source: Ambulatory Visit

## 2013-09-09 DIAGNOSIS — Z1231 Encounter for screening mammogram for malignant neoplasm of breast: Secondary | ICD-10-CM

## 2014-06-13 ENCOUNTER — Other Ambulatory Visit: Payer: Self-pay | Admitting: Neurosurgery

## 2014-06-13 DIAGNOSIS — S32009A Unspecified fracture of unspecified lumbar vertebra, initial encounter for closed fracture: Secondary | ICD-10-CM

## 2014-06-17 ENCOUNTER — Ambulatory Visit
Admission: RE | Admit: 2014-06-17 | Discharge: 2014-06-17 | Disposition: A | Discharge status: Home / Self Care | Payer: Medicare Other | Source: Ambulatory Visit | Attending: Neurosurgery | Admitting: Neurosurgery

## 2014-06-17 DIAGNOSIS — S32009A Unspecified fracture of unspecified lumbar vertebra, initial encounter for closed fracture: Secondary | ICD-10-CM

## 2014-07-15 ENCOUNTER — Other Ambulatory Visit: Payer: Self-pay | Admitting: Neurosurgery

## 2014-07-26 ENCOUNTER — Encounter (HOSPITAL_COMMUNITY): Payer: Self-pay | Admitting: Pharmacy Technician

## 2014-07-26 NOTE — Pre-Procedure Instructions (Signed)
Virginia Rose  07/26/2014   Your procedure is scheduled on:  Wednesday, September 23rd  Report to [place] at 630 AM.  Call this number if you have problems the morning of surgery: [phone]   Remember:   Do not eat food or drink liquids after midnight.   Take these medicines the morning of surgery with A SIP OF WATER: hydrocodone if needed   Do not wear jewelry, make-up or nail polish.  Do not wear lotions, powders, or perfumes. You may wear deodorant.  Do not shave 48 hours prior to surgery. Men may shave face and neck.  Do not bring valuables to the hospital.  [facility] is not responsible for any belongings or valuables.               Contacts, dentures or bridgework may not be worn into surgery.  Leave suitcase in the car. After surgery it may be brought to your room.  For patients admitted to the hospital, discharge time is determined by your treatment team.               Patients discharged the day of surgery will not be allowed to drive home.  Please read over the following fact sheets that you were given: Pain Booklet, Coughing and Deep Breathing, Blood Transfusion Information, MRSA Information and Surgical Site Infection Prevention [place] - Preparing for Surgery  Before surgery, you can play an important role.  Because skin is not sterile, your skin needs to be as free of germs as possible.  You can reduce the number of germs on you skin by washing with CHG (chlorahexidine gluconate) soap before surgery.  CHG is an antiseptic cleaner which kills germs and bonds with the skin to continue killing germs even after washing.  Please DO NOT use if you have an allergy to CHG or antibacterial soaps.  If your skin becomes reddened/irritated stop using the CHG and inform your nurse when you arrive at Short Stay.  Do not shave (including legs and underarms) for at least 48 hours prior to the first CHG shower.  You may shave your face.  Please follow  these instructions carefully:   1.  Shower with CHG Soap the night before surgery and the morning of Surgery.  2.  If you choose to wash your hair, wash your hair first as usual with your normal shampoo.  3.  After you shampoo, rinse your hair and body thoroughly to remove the shampoo.  4.  Use CHG as you would any other liquid soap.  You can apply CHG directly to the skin and wash gently with scrungie or a clean washcloth.  5.  Apply the CHG Soap to your body ONLY FROM THE NECK DOWN.  Do not use on open wounds or open sores.  Avoid contact with your eyes, ears, mouth and genitals (private parts).  Wash genitals (private parts) with your normal soap.  6.  Wash thoroughly, paying special attention to the area where your surgery will be performed.  7.  Thoroughly rinse your body with warm water from the neck down.  8.  DO NOT shower/wash with your normal soap after using and rinsing off the CHG Soap.  9.  Pat yourself dry with a clean towel.            10.  Wear clean pajamas.            11.  Place clean sheets on your bed the  night of your first shower and do not sleep with pets.  Day of Surgery  Do not apply any lotions/deoderants the morning of surgery.  Please wear clean clothes to the hospital/surgery center.

## 2014-07-27 ENCOUNTER — Encounter (HOSPITAL_COMMUNITY): Payer: Self-pay

## 2014-07-27 ENCOUNTER — Encounter (HOSPITAL_COMMUNITY)
Admission: RE | Admit: 2014-07-27 | Discharge: 2014-07-27 | Disposition: A | Discharge status: Home / Self Care | Payer: Medicare Other | Source: Ambulatory Visit | Attending: Neurosurgery | Admitting: Neurosurgery

## 2014-07-27 ENCOUNTER — Ambulatory Visit (HOSPITAL_COMMUNITY)
Admission: RE | Admit: 2014-07-27 | Discharge: 2014-07-27 | Disposition: A | Discharge status: Home / Self Care | Payer: Medicare Other | Source: Ambulatory Visit | Attending: Anesthesiology | Admitting: Anesthesiology

## 2014-07-27 DIAGNOSIS — J449 Chronic obstructive pulmonary disease, unspecified: Secondary | ICD-10-CM | POA: Insufficient documentation

## 2014-07-27 DIAGNOSIS — Z01818 Encounter for other preprocedural examination: Secondary | ICD-10-CM | POA: Diagnosis not present

## 2014-07-27 DIAGNOSIS — J4489 Other specified chronic obstructive pulmonary disease: Secondary | ICD-10-CM | POA: Insufficient documentation

## 2014-07-27 HISTORY — DX: Shortness of breath: R06.02

## 2014-07-27 LAB — BASIC METABOLIC PANEL
Anion gap: 13 (ref 5–15)
BUN: 13 mg/dL (ref 6–23)
CO2: 23 mEq/L (ref 19–32)
Calcium: 9.2 mg/dL (ref 8.4–10.5)
Chloride: 97 mEq/L (ref 96–112)
Creatinine, Ser: 0.6 mg/dL (ref 0.50–1.10)
GFR calc Af Amer: >90 mL/min (ref >90)
GFR calc non Af Amer: >90 mL/min (ref >90)
Glucose, Bld: 82 mg/dL (ref 70–99)
Potassium: 4.2 mEq/L (ref 3.7–5.3)
Sodium: 133 mEq/L — ABNORMAL LOW (ref 137–147)

## 2014-07-27 LAB — CBC
HCT: 38.8 % (ref 36.0–46.0)
Hemoglobin: 13.3 g/dL (ref 12.0–15.0)
MCH: 32.1 pg (ref 26.0–34.0)
MCHC: 34.3 g/dL (ref 30.0–36.0)
MCV: 93.7 fL (ref 78.0–100.0)
Platelets: 257 10*3/uL (ref 150–400)
RBC: 4.14 MIL/uL (ref 3.87–5.11)
RDW: 12.8 % (ref 11.5–15.5)
WBC: 8 10*3/uL (ref 4.0–10.5)

## 2014-07-27 LAB — SURGICAL PCR SCREEN
MRSA, PCR: NEGATIVE
Staphylococcus aureus: NEGATIVE

## 2014-07-28 NOTE — Progress Notes (Signed)
Anesthesia Chart Review:  Pt is 68 year old female scheduled for posterior lateral fusion T10-11, T11-12, T12/L1, L1-2, L2-3, L3-4 for lumbar burst fracture on 08/03/14 with Dr. Saintclair Halsted.   PMH: Hyperlipidemia, COPD  Medications include: simvistatin, fosamax  Preoperative labs reviewed.   Chest x-ray reviewed. Findings c/w COPD, no acute cardiopulmonary abnormality seen.   EKG: NSR, septal infarct (age undetermined). No prior EKG available for comparison.   Pt previously had 1 level posterior lumbar fusion in 2013 with no anesthesia complications.   No CV sx from PAT. Will be further evaluated on DOS by anesthesiologist. If no symptoms, I anticipate pt can proceed with surgery as scheduled.   Willeen Cass, FNP-BC [facility] Short Stay Surgical Center/Anesthesiology Phone: [phone] 07/28/2014 2:53 PM

## 2014-08-02 MED ORDER — DEXAMETHASONE SODIUM PHOSPHATE 10 MG/ML IJ SOLN
10.0000 mg | INTRAMUSCULAR | Status: DC
  Filled 2014-08-02: qty 1

## 2014-08-02 MED ORDER — CEFAZOLIN SODIUM-DEXTROSE 2-3 GM-% IV SOLR
2.0000 g | INTRAVENOUS | Status: AC
  Administered 2014-08-03 (×2): 2 g via INTRAVENOUS
  Filled 2014-08-02: qty 50

## 2014-08-03 ENCOUNTER — Encounter (HOSPITAL_COMMUNITY): Payer: Self-pay | Admitting: *Deleted

## 2014-08-03 ENCOUNTER — Encounter (HOSPITAL_COMMUNITY)
Admission: RE | Disposition: A | Discharge status: Home / Self Care | Payer: Medicare Other | Source: Ambulatory Visit | Attending: Neurosurgery

## 2014-08-03 ENCOUNTER — Inpatient Hospital Stay (HOSPITAL_COMMUNITY)
Admission: RE | Admit: 2014-08-03 | Discharge: 2014-08-08 | DRG: 457 | Disposition: A | Discharge status: Home / Self Care | Payer: Medicare Other | Source: Ambulatory Visit | Attending: Neurosurgery | Admitting: Neurosurgery

## 2014-08-03 ENCOUNTER — Encounter (HOSPITAL_COMMUNITY): Payer: Medicare Other | Admitting: Emergency Medicine

## 2014-08-03 ENCOUNTER — Inpatient Hospital Stay (HOSPITAL_COMMUNITY): Payer: Medicare Other

## 2014-08-03 ENCOUNTER — Inpatient Hospital Stay (HOSPITAL_COMMUNITY): Payer: Medicare Other | Admitting: Anesthesiology

## 2014-08-03 DIAGNOSIS — E440 Moderate protein-calorie malnutrition: Secondary | ICD-10-CM | POA: Diagnosis present

## 2014-08-03 DIAGNOSIS — M8448XA Pathological fracture, other site, initial encounter for fracture: Secondary | ICD-10-CM | POA: Diagnosis present

## 2014-08-03 DIAGNOSIS — E7800 Pure hypercholesterolemia, unspecified: Secondary | ICD-10-CM | POA: Diagnosis present

## 2014-08-03 DIAGNOSIS — J449 Chronic obstructive pulmonary disease, unspecified: Secondary | ICD-10-CM | POA: Diagnosis present

## 2014-08-03 DIAGNOSIS — F17200 Nicotine dependence, unspecified, uncomplicated: Secondary | ICD-10-CM | POA: Diagnosis present

## 2014-08-03 DIAGNOSIS — M810 Age-related osteoporosis without current pathological fracture: Secondary | ICD-10-CM | POA: Diagnosis present

## 2014-08-03 DIAGNOSIS — J4489 Other specified chronic obstructive pulmonary disease: Secondary | ICD-10-CM | POA: Diagnosis present

## 2014-08-03 DIAGNOSIS — Z9849 Cataract extraction status, unspecified eye: Secondary | ICD-10-CM | POA: Diagnosis not present

## 2014-08-03 DIAGNOSIS — M62838 Other muscle spasm: Secondary | ICD-10-CM | POA: Diagnosis not present

## 2014-08-03 DIAGNOSIS — S32001A Stable burst fracture of unspecified lumbar vertebra, initial encounter for closed fracture: Secondary | ICD-10-CM | POA: Diagnosis present

## 2014-08-03 DIAGNOSIS — M4180 Other forms of scoliosis, site unspecified: Secondary | ICD-10-CM | POA: Diagnosis present

## 2014-08-03 DIAGNOSIS — M549 Dorsalgia, unspecified: Secondary | ICD-10-CM | POA: Diagnosis present

## 2014-08-03 HISTORY — PX: POSTERIOR LUMBAR FUSION 4 LEVEL: SHX6037

## 2014-08-03 SURGERY — POSTERIOR LUMBAR FUSION 4 LEVEL
Anesthesia: General | Site: Spine Thoracic

## 2014-08-03 MED ORDER — FENTANYL CITRATE 0.05 MG/ML IJ SOLN
INTRAMUSCULAR | Status: AC
  Filled 2014-08-03: qty 5

## 2014-08-03 MED ORDER — NEOSTIGMINE METHYLSULFATE 10 MG/10ML IV SOLN
INTRAVENOUS | Status: AC
  Filled 2014-08-03: qty 1

## 2014-08-03 MED ORDER — PHENYLEPHRINE HCL 10 MG/ML IJ SOLN
INTRAMUSCULAR | Status: DC | PRN
  Administered 2014-08-03 (×2): 40 ug via INTRAVENOUS
  Administered 2014-08-03 (×2): 80 ug via INTRAVENOUS
  Administered 2014-08-03 (×3): 40 ug via INTRAVENOUS
  Administered 2014-08-03: 80 ug via INTRAVENOUS

## 2014-08-03 MED ORDER — VECURONIUM BROMIDE 10 MG IV SOLR
INTRAVENOUS | Status: AC
  Filled 2014-08-03: qty 10

## 2014-08-03 MED ORDER — SODIUM CHLORIDE 0.9 % IJ SOLN: 3.0000 mL | INTRAMUSCULAR | Status: DC | PRN

## 2014-08-03 MED ORDER — PROPOFOL 10 MG/ML IV BOLUS
INTRAVENOUS | Status: DC | PRN
  Administered 2014-08-03: 150 mg via INTRAVENOUS

## 2014-08-03 MED ORDER — HYDROMORPHONE HCL 1 MG/ML IJ SOLN
INTRAMUSCULAR | Status: AC
  Administered 2014-08-03: 0.5 mg via INTRAVENOUS
  Filled 2014-08-03: qty 1

## 2014-08-03 MED ORDER — PHENOL 1.4 % MT LIQD: 1.0000 | OROMUCOSAL | Status: DC | PRN

## 2014-08-03 MED ORDER — HYDROMORPHONE HCL 1 MG/ML IJ SOLN: 0.5000 mg | INTRAMUSCULAR | Status: DC | PRN

## 2014-08-03 MED ORDER — ALUM & MAG HYDROXIDE-SIMETH 200-200-20 MG/5ML PO SUSP: 30.0000 mL | Freq: Four times a day (QID) | ORAL | Status: DC | PRN

## 2014-08-03 MED ORDER — STERILE WATER FOR INJECTION IJ SOLN
INTRAMUSCULAR | Status: AC
  Filled 2014-08-03: qty 10

## 2014-08-03 MED ORDER — PROPOFOL 10 MG/ML IV BOLUS
INTRAVENOUS | Status: AC
  Filled 2014-08-03: qty 20

## 2014-08-03 MED ORDER — POLYVINYL ALCOHOL 1.4 % OP SOLN
1.0000 [drp] | Freq: Every day | OPHTHALMIC | Status: DC
  Administered 2014-08-04 – 2014-08-07 (×4): 1 [drp] via OPHTHALMIC
  Filled 2014-08-03: qty 15

## 2014-08-03 MED ORDER — HYDROMORPHONE HCL 1 MG/ML IJ SOLN
INTRAMUSCULAR | Status: AC
  Filled 2014-08-03: qty 1

## 2014-08-03 MED ORDER — OXYCODONE-ACETAMINOPHEN 5-325 MG PO TABS
1.0000 | ORAL_TABLET | ORAL | Status: DC | PRN
  Administered 2014-08-03 – 2014-08-04 (×2): 2 via ORAL
  Administered 2014-08-04 – 2014-08-06 (×10): 1 via ORAL
  Administered 2014-08-07 (×2): 2 via ORAL
  Administered 2014-08-07 – 2014-08-08 (×2): 1 via ORAL
  Administered 2014-08-08: 2 via ORAL
  Filled 2014-08-03 (×2): qty 1
  Filled 2014-08-03: qty 2
  Filled 2014-08-03 (×3): qty 1
  Filled 2014-08-03: qty 2
  Filled 2014-08-03: qty 1
  Filled 2014-08-03: qty 2
  Filled 2014-08-03: qty 1
  Filled 2014-08-03: qty 2
  Filled 2014-08-03 (×4): qty 1
  Filled 2014-08-03: qty 2
  Filled 2014-08-03: qty 1

## 2014-08-03 MED ORDER — ONDANSETRON HCL 4 MG/2ML IJ SOLN: 4.0000 mg | INTRAMUSCULAR | Status: DC | PRN

## 2014-08-03 MED ORDER — LIDOCAINE HCL (CARDIAC) 20 MG/ML IV SOLN
INTRAVENOUS | Status: AC
  Filled 2014-08-03: qty 10

## 2014-08-03 MED ORDER — ALBUMIN HUMAN 5 % IV SOLN
INTRAVENOUS | Status: DC | PRN
  Administered 2014-08-03: 11:00:00 via INTRAVENOUS

## 2014-08-03 MED ORDER — ARTIFICIAL TEARS OP OINT
TOPICAL_OINTMENT | OPHTHALMIC | Status: AC
  Filled 2014-08-03: qty 3.5

## 2014-08-03 MED ORDER — ONDANSETRON HCL 4 MG/2ML IJ SOLN
INTRAMUSCULAR | Status: AC
  Filled 2014-08-03: qty 2

## 2014-08-03 MED ORDER — LIDOCAINE-EPINEPHRINE 1 %-1:100000 IJ SOLN
INTRAMUSCULAR | Status: DC | PRN
  Administered 2014-08-03: 10 mL

## 2014-08-03 MED ORDER — GLYCOPYRROLATE 0.2 MG/ML IJ SOLN
INTRAMUSCULAR | Status: AC
  Filled 2014-08-03: qty 4

## 2014-08-03 MED ORDER — ROCURONIUM BROMIDE 100 MG/10ML IV SOLN
INTRAVENOUS | Status: DC | PRN
  Administered 2014-08-03: 35 mg via INTRAVENOUS
  Administered 2014-08-03: 15 mg via INTRAVENOUS

## 2014-08-03 MED ORDER — SIMVASTATIN 40 MG PO TABS
40.0000 mg | ORAL_TABLET | Freq: Every evening | ORAL | Status: DC
  Administered 2014-08-04 – 2014-08-07 (×4): 40 mg via ORAL
  Filled 2014-08-03 (×4): qty 1

## 2014-08-03 MED ORDER — SODIUM CHLORIDE 0.9 % IR SOLN
Status: DC | PRN
  Administered 2014-08-03: 10:00:00

## 2014-08-03 MED ORDER — NEOSTIGMINE METHYLSULFATE 10 MG/10ML IV SOLN
INTRAVENOUS | Status: DC | PRN
  Administered 2014-08-03: 3.5 mg via INTRAVENOUS

## 2014-08-03 MED ORDER — THROMBIN 20000 UNITS EX SOLR
CUTANEOUS | Status: DC | PRN
  Administered 2014-08-03: 10:00:00 via TOPICAL

## 2014-08-03 MED ORDER — LACTATED RINGERS IV SOLN
INTRAVENOUS | Status: DC | PRN
  Administered 2014-08-03 (×3): via INTRAVENOUS

## 2014-08-03 MED ORDER — SODIUM CHLORIDE 0.9 % IJ SOLN
3.0000 mL | Freq: Two times a day (BID) | INTRAMUSCULAR | Status: DC
  Administered 2014-08-03 – 2014-08-07 (×5): 3 mL via INTRAVENOUS

## 2014-08-03 MED ORDER — FENTANYL CITRATE 0.05 MG/ML IJ SOLN
INTRAMUSCULAR | Status: DC | PRN
  Administered 2014-08-03: 50 ug via INTRAVENOUS
  Administered 2014-08-03: 150 ug via INTRAVENOUS
  Administered 2014-08-03 (×2): 50 ug via INTRAVENOUS

## 2014-08-03 MED ORDER — CEFAZOLIN SODIUM 1-5 GM-% IV SOLN
1.0000 g | Freq: Three times a day (TID) | INTRAVENOUS | Status: DC
  Administered 2014-08-03 – 2014-08-08 (×13): 1 g via INTRAVENOUS
  Filled 2014-08-03 (×15): qty 50

## 2014-08-03 MED ORDER — GLYCOPYRROLATE 0.2 MG/ML IJ SOLN
INTRAMUSCULAR | Status: DC | PRN
  Administered 2014-08-03: .7 mg via INTRAVENOUS

## 2014-08-03 MED ORDER — ONDANSETRON HCL 4 MG/2ML IJ SOLN: 4.0000 mg | Freq: Once | INTRAMUSCULAR | Status: DC | PRN

## 2014-08-03 MED ORDER — DOCUSATE SODIUM 100 MG PO CAPS
100.0000 mg | ORAL_CAPSULE | Freq: Two times a day (BID) | ORAL | Status: DC
  Administered 2014-08-03 – 2014-08-08 (×10): 100 mg via ORAL
  Filled 2014-08-03 (×10): qty 1

## 2014-08-03 MED ORDER — SODIUM CHLORIDE 0.9 % IV SOLN: 250.0000 mL | INTRAVENOUS | Status: DC

## 2014-08-03 MED ORDER — CEFAZOLIN SODIUM-DEXTROSE 2-3 GM-% IV SOLR
INTRAVENOUS | Status: AC
  Filled 2014-08-03: qty 50

## 2014-08-03 MED ORDER — DEXAMETHASONE SODIUM PHOSPHATE 10 MG/ML IJ SOLN
INTRAMUSCULAR | Status: DC | PRN
  Administered 2014-08-03: 10 mg via INTRAVENOUS

## 2014-08-03 MED ORDER — VECURONIUM BROMIDE 10 MG IV SOLR
INTRAVENOUS | Status: DC | PRN
  Administered 2014-08-03: 3 mg via INTRAVENOUS
  Administered 2014-08-03 (×2): 2 mg via INTRAVENOUS
  Administered 2014-08-03: 3 mg via INTRAVENOUS

## 2014-08-03 MED ORDER — ACETAMINOPHEN 650 MG RE SUPP: 650.0000 mg | RECTAL | Status: DC | PRN

## 2014-08-03 MED ORDER — MIDAZOLAM HCL 2 MG/2ML IJ SOLN
INTRAMUSCULAR | Status: AC
  Filled 2014-08-03: qty 2

## 2014-08-03 MED ORDER — INFLUENZA VAC SPLIT QUAD 0.5 ML IM SUSY
0.5000 mL | PREFILLED_SYRINGE | INTRAMUSCULAR | Status: AC
  Administered 2014-08-04: 0.5 mL via INTRAMUSCULAR
  Filled 2014-08-03: qty 0.5

## 2014-08-03 MED ORDER — ARTIFICIAL TEARS OP OINT
TOPICAL_OINTMENT | OPHTHALMIC | Status: DC | PRN
  Administered 2014-08-03: 1 via OPHTHALMIC

## 2014-08-03 MED ORDER — ROCURONIUM BROMIDE 50 MG/5ML IV SOLN
INTRAVENOUS | Status: AC
  Filled 2014-08-03: qty 1

## 2014-08-03 MED ORDER — MENTHOL 3 MG MT LOZG: 1.0000 | LOZENGE | OROMUCOSAL | Status: DC | PRN

## 2014-08-03 MED ORDER — CYCLOBENZAPRINE HCL 10 MG PO TABS
10.0000 mg | ORAL_TABLET | Freq: Three times a day (TID) | ORAL | Status: DC | PRN
  Administered 2014-08-03 – 2014-08-08 (×8): 10 mg via ORAL
  Filled 2014-08-03 (×8): qty 1

## 2014-08-03 MED ORDER — ACETAMINOPHEN 325 MG PO TABS
650.0000 mg | ORAL_TABLET | ORAL | Status: DC | PRN
  Administered 2014-08-04: 650 mg via ORAL
  Filled 2014-08-03: qty 2

## 2014-08-03 MED ORDER — LIDOCAINE HCL (CARDIAC) 20 MG/ML IV SOLN
INTRAVENOUS | Status: DC | PRN
  Administered 2014-08-03: 100 mg via INTRAVENOUS

## 2014-08-03 MED ORDER — EPHEDRINE SULFATE 50 MG/ML IJ SOLN
INTRAMUSCULAR | Status: AC
  Filled 2014-08-03: qty 1

## 2014-08-03 MED ORDER — PHENYLEPHRINE 40 MCG/ML (10ML) SYRINGE FOR IV PUSH (FOR BLOOD PRESSURE SUPPORT)
PREFILLED_SYRINGE | INTRAVENOUS | Status: AC
  Filled 2014-08-03: qty 10

## 2014-08-03 MED ORDER — 0.9 % SODIUM CHLORIDE (POUR BTL) OPTIME
TOPICAL | Status: DC | PRN
  Administered 2014-08-03: 1000 mL

## 2014-08-03 MED ORDER — HYDROMORPHONE HCL 1 MG/ML IJ SOLN
0.2500 mg | INTRAMUSCULAR | Status: DC | PRN
  Administered 2014-08-03 (×3): 0.5 mg via INTRAVENOUS

## 2014-08-03 MED ORDER — HYPROMELLOSE (GONIOSCOPIC) 2.5 % OP SOLN: 1.0000 [drp] | Freq: Every day | OPHTHALMIC | Status: DC

## 2014-08-03 MED ORDER — MIDAZOLAM HCL 5 MG/5ML IJ SOLN
INTRAMUSCULAR | Status: DC | PRN
  Administered 2014-08-03: 2 mg via INTRAVENOUS

## 2014-08-03 MED ORDER — ALENDRONATE SODIUM 70 MG PO TABS
70.0000 mg | ORAL_TABLET | ORAL | Status: DC
Start: 2014-08-03 — End: 2014-08-03

## 2014-08-03 MED ORDER — ONDANSETRON HCL 4 MG/2ML IJ SOLN
INTRAMUSCULAR | Status: DC | PRN
  Administered 2014-08-03: 4 mg via INTRAVENOUS

## 2014-08-03 MED ORDER — CYCLOBENZAPRINE HCL 10 MG PO TABS: 10.0000 mg | ORAL_TABLET | Freq: Three times a day (TID) | ORAL | Status: DC | PRN

## 2014-08-03 MED ORDER — HYDROCODONE-ACETAMINOPHEN 5-325 MG PO TABS: 1.0000 | ORAL_TABLET | Freq: Four times a day (QID) | ORAL | Status: DC | PRN

## 2014-08-03 MED ORDER — SENNA 8.6 MG PO TABS
1.0000 | ORAL_TABLET | Freq: Two times a day (BID) | ORAL | Status: DC
  Administered 2014-08-03 – 2014-08-08 (×10): 8.6 mg via ORAL
  Filled 2014-08-03 (×10): qty 1

## 2014-08-03 MED ORDER — BUPIVACAINE HCL (PF) 0.25 % IJ SOLN
INTRAMUSCULAR | Status: DC | PRN
  Administered 2014-08-03: 20 mL

## 2014-08-03 SURGICAL SUPPLY — 79 items
ADH SKN CLS APL DERMABOND .7 (GAUZE/BANDAGES/DRESSINGS)
ADH SKN CLS LQ APL DERMABOND (GAUZE/BANDAGES/DRESSINGS) ×2
ALLOSTEM STRIP 20MMX25MM (Tissue) ×2 IMPLANT
ALLOSTEM STRIP 20MMX50MM (Tissue) ×2 IMPLANT
APL SKNCLS STERI-STRIP NONHPOA (GAUZE/BANDAGES/DRESSINGS) ×1
BAG DECANTER FOR FLEXI CONT (MISCELLANEOUS) ×2 IMPLANT
BENZOIN TINCTURE PRP APPL 2/3 (GAUZE/BANDAGES/DRESSINGS) ×2 IMPLANT
BLADE SURG 11 STRL SS (BLADE) ×2 IMPLANT
BLADE SURG ROTATE 9660 (MISCELLANEOUS) IMPLANT
BONE ALLOSTEM MORSELIZED 5CC (Bone Implant) ×1 IMPLANT
BRUSH SCRUB EZ PLAIN DRY (MISCELLANEOUS) ×2 IMPLANT
BUR MATCHSTICK NEURO 3.0 LAGG (BURR) ×2 IMPLANT
BUR PRECISION FLUTE 6.0 (BURR) ×2 IMPLANT
CANISTER SUCT 3000ML (MISCELLANEOUS) ×2 IMPLANT
CAP LOCKING THREADED (Cap) ×10 IMPLANT
CONT SPEC 4OZ CLIKSEAL STRL BL (MISCELLANEOUS) ×4 IMPLANT
COVER BACK TABLE 24X17X13 BIG (DRAPES) IMPLANT
COVER TABLE BACK 60X90 (DRAPES) ×2 IMPLANT
CROSSLINK SPINAL FUSION (Cage) ×1 IMPLANT
DECANTER SPIKE VIAL GLASS SM (MISCELLANEOUS) ×2 IMPLANT
DERMABOND ADHESIVE PROPEN (GAUZE/BANDAGES/DRESSINGS) ×2
DERMABOND ADVANCED (GAUZE/BANDAGES/DRESSINGS)
DERMABOND ADVANCED .7 DNX12 (GAUZE/BANDAGES/DRESSINGS) ×1 IMPLANT
DERMABOND ADVANCED .7 DNX6 (GAUZE/BANDAGES/DRESSINGS) IMPLANT
DRAPE C-ARM 42X72 X-RAY (DRAPES) ×3 IMPLANT
DRAPE C-ARMOR (DRAPES) ×1 IMPLANT
DRAPE LAPAROTOMY 100X72X124 (DRAPES) ×2 IMPLANT
DRAPE POUCH INSTRU U-SHP 10X18 (DRAPES) ×2 IMPLANT
DRAPE PROXIMA HALF (DRAPES) IMPLANT
DRAPE SURG 17X23 STRL (DRAPES) ×2 IMPLANT
DRSG OPSITE 4X5.5 SM (GAUZE/BANDAGES/DRESSINGS) ×3 IMPLANT
DRSG OPSITE POSTOP 4X10 (GAUZE/BANDAGES/DRESSINGS) ×1 IMPLANT
DRSG OPSITE POSTOP 4X6 (GAUZE/BANDAGES/DRESSINGS) ×1 IMPLANT
DURAPREP 26ML APPLICATOR (WOUND CARE) ×2 IMPLANT
ELECT REM PT RETURN 9FT ADLT (ELECTROSURGICAL) ×2
ELECTRODE REM PT RTRN 9FT ADLT (ELECTROSURGICAL) ×1 IMPLANT
EVACUATOR 3/16  PVC DRAIN (DRAIN) ×1
EVACUATOR 3/16 PVC DRAIN (DRAIN) ×1 IMPLANT
GAUZE SPONGE 4X4 12PLY STRL (GAUZE/BANDAGES/DRESSINGS) ×2 IMPLANT
GAUZE SPONGE 4X4 16PLY XRAY LF (GAUZE/BANDAGES/DRESSINGS) ×1 IMPLANT
GLOVE BIO SURGEON STRL SZ8 (GLOVE) ×4 IMPLANT
GLOVE BIOGEL PI IND STRL 7.0 (GLOVE) IMPLANT
GLOVE BIOGEL PI INDICATOR 7.0 (GLOVE) ×4
GLOVE ECLIPSE 6.5 STRL STRAW (GLOVE) ×1 IMPLANT
GLOVE EXAM NITRILE LRG STRL (GLOVE) IMPLANT
GLOVE EXAM NITRILE MD LF STRL (GLOVE) IMPLANT
GLOVE EXAM NITRILE XL STR (GLOVE) IMPLANT
GLOVE EXAM NITRILE XS STR PU (GLOVE) IMPLANT
GLOVE INDICATOR 8.5 STRL (GLOVE) ×4 IMPLANT
GLOVE SS BIOGEL STRL SZ 6.5 (GLOVE) IMPLANT
GLOVE SUPERSENSE BIOGEL SZ 6.5 (GLOVE) ×3
GOWN STRL REUS W/ TWL LRG LVL3 (GOWN DISPOSABLE) IMPLANT
GOWN STRL REUS W/ TWL XL LVL3 (GOWN DISPOSABLE) ×2 IMPLANT
GOWN STRL REUS W/TWL 2XL LVL3 (GOWN DISPOSABLE) IMPLANT
GOWN STRL REUS W/TWL LRG LVL3 (GOWN DISPOSABLE) ×4
GOWN STRL REUS W/TWL XL LVL3 (GOWN DISPOSABLE) ×4
KIT BASIN OR (CUSTOM PROCEDURE TRAY) ×2 IMPLANT
KIT ROOM TURNOVER OR (KITS) ×2 IMPLANT
MIX DBX 10CC 35% BONE (Bone Implant) ×3 IMPLANT
NDL HYPO 25X1 1.5 SAFETY (NEEDLE) ×1 IMPLANT
NEEDLE HYPO 25X1 1.5 SAFETY (NEEDLE) ×2 IMPLANT
NS IRRIG 1000ML POUR BTL (IV SOLUTION) ×2 IMPLANT
PACK LAMINECTOMY NEURO (CUSTOM PROCEDURE TRAY) ×2 IMPLANT
PAD ARMBOARD 7.5X6 YLW CONV (MISCELLANEOUS) ×8 IMPLANT
SCREW CREO 5.5X40 (Screw) ×6 IMPLANT
SCREW CREO MOD AMP 5.5X45MM (Screw) ×4 IMPLANT
SCREW PA THRD CREO TULIP 5.5X4 (Head) ×10 IMPLANT
SPONGE LAP 4X18 X RAY DECT (DISPOSABLE) IMPLANT
SPONGE SURGIFOAM ABS GEL 100 (HEMOSTASIS) ×2 IMPLANT
STRIP CLOSURE SKIN 1/2X4 (GAUZE/BANDAGES/DRESSINGS) ×5 IMPLANT
SUT VIC AB 0 CT1 18XCR BRD8 (SUTURE) ×2 IMPLANT
SUT VIC AB 0 CT1 8-18 (SUTURE) ×4
SUT VIC AB 2-0 CT1 18 (SUTURE) ×4 IMPLANT
SUT VICRYL 4-0 PS2 18IN ABS (SUTURE) ×2 IMPLANT
SYR 20ML ECCENTRIC (SYRINGE) ×2 IMPLANT
TOWEL OR 17X24 6PK STRL BLUE (TOWEL DISPOSABLE) ×2 IMPLANT
TOWEL OR 17X26 10 PK STRL BLUE (TOWEL DISPOSABLE) ×2 IMPLANT
TRAY FOLEY CATH 14FRSI W/METER (CATHETERS) ×2 IMPLANT
WATER STERILE IRR 1000ML POUR (IV SOLUTION) ×2 IMPLANT

## 2014-08-03 NOTE — Anesthesia Preprocedure Evaluation (Addendum)
Anesthesia Evaluation  Patient identified by MRN, date of birth, ID band Patient awake    Reviewed: Allergy & Precautions  Airway Mallampati: I TM Distance: >3 FB Neck ROM: full    Dental  (+) Teeth Intact, Chipped, Dental Advidsory Given, Implants, Partial Lower   Pulmonary shortness of breath and with exertion, COPDCurrent Smoker,          Cardiovascular Rhythm:regular     Neuro/Psych    GI/Hepatic   Endo/Other    Renal/GU      Musculoskeletal  (+) Arthritis -,   Abdominal   Peds  Hematology   Anesthesia Other Findings Chipped left front tooth  Reproductive/Obstetrics                          Anesthesia Physical Anesthesia Plan  ASA: II  Anesthesia Plan: General ETT and General   Post-op Pain Management:    Induction: Intravenous  Airway Management Planned: Oral ETT  Additional Equipment:   Intra-op Plan:   Post-operative Plan: Extubation in OR  Informed Consent: I have reviewed the patients History and Physical, chart, labs and discussed the procedure including the risks, benefits and alternatives for the proposed anesthesia with the patient or authorized representative who has indicated his/her understanding and acceptance.   Dental Advisory Given  Plan Discussed with: Anesthesiologist, CRNA and Surgeon  Anesthesia Plan Comments:        Anesthesia Quick Evaluation

## 2014-08-03 NOTE — Anesthesia Procedure Notes (Signed)
Procedure Name: Intubation Date/Time: 08/03/2014 8:34 AM Performed by: Neldon Newport Pre-anesthesia Checklist: Patient identified, Timeout performed, Emergency Drugs available, Suction available and Patient being monitored Patient Re-evaluated:Patient Re-evaluated prior to inductionOxygen Delivery Method: Circle system utilized Preoxygenation: Pre-oxygenation with 100% oxygen Intubation Type: IV induction Ventilation: Mask ventilation without difficulty Laryngoscope Size: Mac and 3 Grade View: Grade I Tube type: Oral Tube size: 7.0 mm Number of attempts: 1 Airway Equipment and Method: LTA kit utilized Placement Confirmation: positive ETCO2,  ETT inserted through vocal cords under direct vision and breath sounds checked- equal and bilateral Secured at: 22 cm Tube secured with: Tape Dental Injury: Teeth and Oropharynx as per pre-operative assessment

## 2014-08-03 NOTE — Anesthesia Postprocedure Evaluation (Signed)
  Anesthesia Post-op Note  Patient: Virginia Rose  Procedure(s) Performed: Procedure(s) with comments: THORACIC TEN-ELEVEN,THORACIC ELEVEN-TWELVE,THORACIC TWELVE-LUMBAR ONE,LUMBARTWO-THREE,LUMBAR THREE-FOUR POSTERIOR LATERAL FUSION (N/A) - Site includes thoracic and lumbar spine  Patient Location: PACU  Anesthesia Type:General  Level of Consciousness: awake, oriented, sedated and patient cooperative  Airway and Oxygen Therapy: Patient Spontanous Breathing  Post-op Pain: moderate  Post-op Assessment: Post-op Vital signs reviewed, Patient's Cardiovascular Status Stable, Respiratory Function Stable, Patent Airway, No signs of Nausea or vomiting and Pain level controlled  Post-op Vital Signs: stable  Last Vitals:  Filed Vitals:   08/03/14 1400  BP: 117/73  Pulse: 93  Temp:   Resp: 16    Complications: No apparent anesthesia complications

## 2014-08-03 NOTE — Progress Notes (Signed)
PHARMACIST - PHYSICIAN COMMUNICATION  CONCERNING: P&T Medication Policy Regarding Oral Bisphosphonates  RECOMMENDATION: Your order for alendronate (Fosamax), ibandronate (Boniva), or risedronate (Actonel) has been discontinued at this time.  If the patient's post-hospital medical condition warrants safe use of this class of drugs, please resume the pre-hospital regimen upon discharge.  DESCRIPTION:  Alendronate (Fosamax), ibandronate (Boniva), and risedronate (Actonel) can cause severe esophageal erosions in patients who are unable to remain upright at least 30 minutes after taking this medication.   Since brief interruptions in therapy are thought to have minimal impact on bone mineral density, the [facility] has established that bisphosphonate orders should be routinely discontinued during hospitalization.   To override this safety policy and permit administration of Boniva, Fosamax, or Actonel in the hospital, prescribers must write "DO NOT HOLD" in the comments section when placing the order for this class of medications.  Sloan Leiter, PharmD, BCPS Clinical Pharmacist [phone] 08/03/2014, 5:22 PM

## 2014-08-03 NOTE — Op Note (Signed)
Preoperative diagnosis: L1 burst fracture and degenerative lumbar scoliosis with instability  Postoperative diagnosis: Same  Procedure: Posterior thoraco- lumbar nonsegmental stabilization from T10-L4 with the globus creo amp pedicle screw system with the addition set and posterior lateral arthrodesis T10-L4 using locally harvested autograft the spinous process and lamina of L1 and allostem morsels and strips  Surgeon: Dominica Severin Joelys Staubs  Anesthesia: Gen.  EBL: Less than 500  History of present illness: Patient is a 68 year old female who was moving a piano and felt immediate pain in her back initially had what looked like an L1 compression fracture patient was placed in a brace followup as an outpatient with serial x-rays show progressive conversion to a more true burst fracture with progressive loss of height and kyphosis. Both on imaging and with history this is felt to be a benign osteoporotic compression fracture however he was creating instability and progressive disability the patient. So I recommended posterior stabilization from T10-L4 due to her osteoporotic bone and degenerative scoliosis on top of the L1 burst fracture. I extensively went over the risks and benefits of the operation the patient as well as perioperative course expectations of outcome and alternatives surgery and she understood and agreed to proceed forward.  Operative procedure: Patient brought into the or was induced under general anesthesia positioned prone the Wilson frame her back was prepped and draped in routine sterile fashion her old incision was opened up and extended cephalad subperiosteal dissections care lamina of T10, T11, T12, L1, L2, L3, down the fusion mass at L4-5. Fusion L4-5 appear to be solid. So this whenever the spinous process of L1-S1 lamina to harvested autograft and did a small decompressive laminectomy in order to harvest the autograft as well as decompress the posterior aspect of thecal sac and then  attention second 2 pedicle screw placement using a combination of AP and lateral fluoroscopy pilot holes were drilled from T10 down to L3 pedicles were all probed cannulated probe and tapped probed and 5 5 x 40 screws were placed at T10, 11, and T12 and 5 5 x 45 screws were placed at L2 and L3. After all screws in place I attempted to dissect out the old fusion construct to attach connectors to hook up into the thoracolumbar fusion mass. I was unable to line up a connector adequately on the patient's right but I was able to on the left. Is a using a single connector rods were cut and fashioned and on the left side I attached the rods and torqued down all the nuts torqued down the connector and then carotid and placed a rod from T10-L3 on the patient's right side. At this point the wound was copious irrigated meticulous hemostasis was maintained across it was applied and then aggressive decortication was care on the lateral masses TPs the laminas from T10 down to L4 the allostem, autograft mixture with both morsels and strips was then placed posterior laterally along the TPs from T12-L4 and along the laminar facet complexes from T10-T12. A large Hemovac drain was placed because hemostasis was maintained the wounds and closed in layers with after Vicryl skin was closed running 4 subcuticular Dermabond benzo and Steri-Strips 4 x 4's and sterile dressings were applied and patient recovered in stable condition. At the end of case on it counts sponge counts were correct.

## 2014-08-03 NOTE — Progress Notes (Signed)
Utilization review completed.  

## 2014-08-03 NOTE — H&P (Signed)
Virginia Rose is an 68 y.o. female.   Chief Complaint: Back and hip pain HPI: Patient is a 68 year old female with severe osteoporosis who was moving a piano from against the wall and experienced immediate pop and pain in her back patient was worked up and noted to have an L1 burst fracture patient was placed in a brace and followed with serial x-rays however the fracture continued to progress with increased vertebral body collapse increased angulation and kyphosis. Due to relative instability on serial x-rays and failure of management in a brace I recommended posterior spinal stabilization from T10 down at L4 and tying into her old construct. I have extensively reviewed the risks and benefits of the operation with the patient as well as perioperative course and expectations of outcome and alternatives of surgery and she understood and agreed to proceed forward.  Past Medical History  Diagnosis Date  . COPD (chronic obstructive pulmonary disease)   . Hypercholesteremia   . Osteoporosis   . Arthritis   . Shortness of breath     with exertion    Past Surgical History  Procedure Laterality Date  . Abdominal hysterectomy    . Appendectomy    . Eye surgery      bilateral cataract removal  . Colonoscopy w/ polypectomy      History reviewed. No pertinent family history. Social History:  reports that she has been smoking Cigarettes.  She has a 24 pack-year smoking history. She has never used smokeless tobacco. She reports that she drinks alcohol. She reports that she does not use illicit drugs.  Allergies: No Known Allergies  Medications Prior to Admission  Medication Sig Dispense Refill  . alendronate (FOSAMAX) 70 MG tablet Take 70 mg by mouth every 7 (seven) days. Take with a full glass of water on an empty stomach. Take on Mondays      . cyclobenzaprine (FLEXERIL) 10 MG tablet Take 1 tablet (10 mg total) by mouth 3 (three) times daily as needed for muscle spasms.  80 tablet  1  .  HYDROcodone-acetaminophen (NORCO/VICODIN) 5-325 MG per tablet Take 1 tablet by mouth every 6 (six) hours as needed for moderate pain.      . hydroxypropyl methylcellulose (ISOPTO TEARS) 2.5 % ophthalmic solution Place 1 drop into both eyes daily.      . simvastatin (ZOCOR) 40 MG tablet Take 40 mg by mouth every evening.        No results found for this or any previous visit (from the past 48 hour(s)). No results found.  Review of Systems  Constitutional: Negative.   HENT: Negative.   Eyes: Negative.   Respiratory: Negative.   Cardiovascular: Negative.   Gastrointestinal: Negative.   Genitourinary: Negative.   Musculoskeletal: Positive for back pain and joint pain.  Skin: Negative.   Neurological: Negative.     Blood pressure 143/56, pulse 92, temperature 97 F (36.1 C), temperature source Oral, resp. rate 18, SpO2 100.00%. Physical Exam  Constitutional: She is oriented to person, place, and time. She appears well-developed and well-nourished.  HENT:  Head: Normocephalic.  Eyes: Pupils are equal, round, and reactive to light.  Neck: Normal range of motion.  Respiratory: Effort normal.  GI: Soft.  Neurological: She is alert and oriented to person, place, and time. She has normal strength. She displays a negative Romberg sign. GCS eye subscore is 4. GCS verbal subscore is 5. GCS motor subscore is 6.  Reflex Scores:      Patellar reflexes are 0  on the right side and 0 on the left side.      Achilles reflexes are 0 on the right side and 0 on the left side. Strength is 5 out of 5 in her iliopsoas, quads, hamstrings, gastrocs, into tibialis, and EHL.  Skin: Skin is warm and dry.     Assessment/Plan 71 or female presents for posterior spinal fusion from T10 and L4  Virginia Rose P 08/03/2014, 8:18 AM

## 2014-08-03 NOTE — Transfer of Care (Signed)
Immediate Anesthesia Transfer of Care Note  Patient: Virginia Rose  Procedure(s) Performed: Procedure(s) with comments: THORACIC TEN-ELEVEN,THORACIC ELEVEN-TWELVE,THORACIC TWELVE-LUMBAR ONE,LUMBARTWO-THREE,LUMBAR THREE-FOUR POSTERIOR LATERAL FUSION (N/A) - Site includes thoracic and lumbar spine  Patient Location: PACU  Anesthesia Type:General  Level of Consciousness: sedated  Airway & Oxygen Therapy: Patient Spontanous Breathing and Patient connected to face mask oxygen  Post-op Assessment: Report given to PACU RN, Post -op Vital signs reviewed and stable and Patient moving all extremities X 4  Post vital signs: Reviewed and stable  Complications: No apparent anesthesia complications

## 2014-08-04 DIAGNOSIS — M4180 Other forms of scoliosis, site unspecified: Secondary | ICD-10-CM | POA: Diagnosis not present

## 2014-08-04 MED ORDER — SODIUM CHLORIDE 0.9 % IV BOLUS (SEPSIS)
500.0000 mL | Freq: Once | INTRAVENOUS | Status: AC
  Administered 2014-08-04: 500 mL via INTRAVENOUS

## 2014-08-04 MED FILL — Heparin Sodium (Porcine) Inj 1000 Unit/ML: INTRAMUSCULAR | Qty: 30 | Status: AC

## 2014-08-04 MED FILL — Sodium Chloride IV Soln 0.9%: INTRAVENOUS | Qty: 1000 | Status: AC

## 2014-08-04 NOTE — Progress Notes (Signed)
Orthopedic Tech Progress Note Patient Details:  [name] May 14, 1946 [id number] Called TLSO order in to [employer]. Patient ID: Virginia Rose, female   DOB: 19-Jan-1946, 68 y.o.   MRN: [id number]   [name] 08/04/2014, 10:03 AM

## 2014-08-04 NOTE — Progress Notes (Signed)
OT Cancellation Note  Patient Details Name: Virginia Rose MRN: [id number] DOB: 04-06-1946   Cancelled Treatment:    Reason Eval/Treat Not Completed: Patient not medically ready (waiting TLSO for mobility) Rn called to verify that TLSO order received by ortho tech and now awaiting arrival of brace to begin evaluation. Handout provided and brief overview of back precautions provided.  Peri Maris Pager: [phone]  08/04/2014, 10:03 AM

## 2014-08-04 NOTE — Evaluation (Signed)
Occupational Therapy Evaluation Patient Details Name: Virginia Rose MRN: [id number] DOB: 1946-08-14 Today's Date: 08/04/2014    History of Present Illness 68 yo female s/p T10- L4 with TLSO   Clinical Impression   PT admitted with T10-L4. Pt currently with functional limitiations due to the deficits listed below (see OT problem list). PTA independent living alone Pt will benefit from skilled OT to increase their independence and safety with adls and balance to allow discharge home with no follow up.     Follow Up Recommendations  No OT follow up    Equipment Recommendations  None recommended by OT    Recommendations for Other Services       Precautions / Restrictions Precautions Precautions: Back Precaution Booklet Issued: Yes (comment) Precaution Comments: provided back handout and reviewed in detail Required Braces or Orthoses: Spinal Brace Spinal Brace: Thoracolumbosacral orthotic;Applied in sitting position      Mobility Bed Mobility Overal bed mobility: Needs Assistance Bed Mobility: Supine to Sit     Supine to sit: Min assist   Sit to sidelying: Min assist General bed mobility comments: cues for sequence with back precautions  Transfers Overall transfer level: Needs assistance Equipment used: Rolling walker (2 wheeled) Transfers: Sit to/from Stand Sit to Stand: Min assist         General transfer comment: cues for hand placement    Balance Overall balance assessment: No apparent balance deficits (not formally assessed)                                          ADL Overall ADL's : Needs assistance/impaired     Grooming: Wash/dry face;Min guard;Standing       Lower Body Bathing: Minimal assistance;Sit to/from stand           Toilet Transfer: Minimal assistance;RW           Functional mobility during ADLs: Minimal assistance;Rolling walker General ADL Comments: Pt educated on LB dressing and able to cross bil LE at  EOB. pt pulling up socks.pt requries total (A) at this time with TLSO brace. Ot to further educate next session. pt progressed ~20 ft in ambulation and positioned in chair.      Vision                     Perception     Praxis      Pertinent Vitals/Pain Pain Assessment: 0-10 Pain Score: 9  Pain Location: back Pain Intervention(s): Repositioned     Hand Dominance Right   Extremity/Trunk Assessment Upper Extremity Assessment Upper Extremity Assessment: Defer to OT evaluation RUE Deficits / Details: numbness at finger tips LUE Deficits / Details: numbness at finger tips   Lower Extremity Assessment Lower Extremity Assessment: Overall WFL for tasks assessed;Generalized weakness   Cervical / Trunk Assessment Cervical / Trunk Assessment: Normal   Communication Communication Communication: No difficulties   Cognition Arousal/Alertness: Awake/alert Behavior During Therapy: WFL for tasks assessed/performed Overall Cognitive Status: Within Functional Limits for tasks assessed                     General Comments       Exercises       Shoulder Instructions      Home Living Family/patient expects to be discharged to:: Private residence Living Arrangements: Alone Available Help at Discharge: Family;Friend(s);Available PRN/intermittently (son, brother and friend) Type  of Home: House Home Access: Level entry     Home Layout: Laundry or work area in basement;Two level;Able to live on main level with bedroom/bathroom     Bathroom Shower/Tub: Tub/shower unit Shower/tub characteristics: Architectural technologist: Handicapped height     Home Equipment: Crutches          Prior Functioning/Environment Level of Independence: Independent             OT Diagnosis: Generalized weakness;Acute pain   OT Problem List: Decreased strength;Decreased activity tolerance;Impaired balance (sitting and/or standing);Decreased safety awareness;Decreased knowledge  of use of DME or AE;Decreased knowledge of precautions;Pain   OT Treatment/Interventions: Self-care/ADL training;Therapeutic exercise;DME and/or AE instruction;Therapeutic activities;Patient/family education;Balance training    OT Goals(Current goals can be found in the care plan section) Acute Rehab OT Goals Patient Stated Goal: to be back to doing for myself OT Goal Formulation: With patient Time For Goal Achievement: 08/18/14 Potential to Achieve Goals: Good  OT Frequency: Min 2X/week   Barriers to D/C:            Co-evaluation              End of Session Equipment Utilized During Treatment: Gait belt;Rolling walker;Back brace Nurse Communication: Mobility status;Precautions  Activity Tolerance: Patient tolerated treatment well Patient left: in chair;with call bell/phone within reach   Time: [id number] ([id number]) OT Time Calculation (min): 12 min Charges:  OT General Charges $OT Visit: 1 Procedure OT Evaluation $Initial OT Evaluation Tier I: 1 Procedure OT Treatments $Self Care/Home Management : 8-22 mins G-Codes:    Parke Poisson B 08/15/14, 1:32 PM Pager: [phone]

## 2014-08-04 NOTE — Progress Notes (Signed)
Subjective: Patient reports Doing well no leg pain and soreness in her back  Objective: Vital signs in last 24 hours: Temp:  [97.2 F (36.2 C)-98.4 F (36.9 C)] 98 F (36.7 C) (09/24 0610) Pulse Rate:  [83-104] 100 (09/24 0610) Resp:  [12-18] 18 (09/24 0610) BP: (89-133)/(38-73) 90/48 mmHg (09/24 0632) SpO2:  [90 %-100 %] 96 % (09/24 0610) Weight:  [48.444 kg (106 lb 12.8 oz)] 48.444 kg (106 lb 12.8 oz) (09/23 1705)  Intake/Output from previous day: 09/23 0701 - 09/24 0700 In: 2753 [I.V.:2503; IV Piggyback:250] Out: 1300 [Urine:425; Drains:575; Blood:300] Intake/Output this shift:    Strength out of 5 wound clean dry and intact  Lab Results: No results found for this basename: WBC, HGB, HCT, PLT,  in the last 72 hours BMET No results found for this basename: NA, K, CL, CO2, GLUCOSE, BUN, CREATININE, CALCIUM,  in the last 72 hours  Studies/Results: Dg Thoracolumbar Spine  08/03/2014   CLINICAL DATA:  Compression fracture related to moving a piano. Progressive deformity.  EXAM: DG C-ARM 61-120 MIN; THORACOLUMBAR SPINE - 2 VIEW  : FLUOROSCOPY TIME:  Please refer to operative report.  COMPARISON:  Multiple priors.  FINDINGS: C-arm films document spinal fusion from T10 through L4, with stabilization to the previous L4-5 instrumentation. Pedicle screws have been placed above and below the L1 compression deformity.  IMPRESSION: As above.   Electronically Signed   By: Rolla Flatten M.D.   On: 08/03/2014 12:39   Dg C-arm 1-60 Min  08/03/2014   CLINICAL DATA:  Compression fracture related to moving a piano. Progressive deformity.  EXAM: DG C-ARM 61-120 MIN; THORACOLUMBAR SPINE - 2 VIEW  : FLUOROSCOPY TIME:  Please refer to operative report.  COMPARISON:  Multiple priors.  FINDINGS: C-arm films document spinal fusion from T10 through L4, with stabilization to the previous L4-5 instrumentation. Pedicle screws have been placed above and below the L1 compression deformity.  IMPRESSION: As above.    Electronically Signed   By: Rolla Flatten M.D.   On: 08/03/2014 12:39    Assessment/Plan: Get a good TLSO for her to mobilize with physical and occupational therapy  LOS: 1 day     Dannell Raczkowski P 08/04/2014, 7:50 AM

## 2014-08-04 NOTE — Progress Notes (Signed)
Foley removed. BP 84/38. Rechecked manually as 90/48. Did not ambulate patient due to low BP. MD paged.

## 2014-08-04 NOTE — Evaluation (Signed)
Physical Therapy Evaluation Patient Details Name: Virginia Rose MRN: [id number] DOB: Oct 26, 1946 Today's Date: 08/04/2014   History of Present Illness  68 yo female s/p T10- L4 with TLSO  Clinical Impression  Pt admitted with/for multi-level lumbar surgery .  Pt currently limited functionally due to the problems listed below.  (see problems list.)  Pt will benefit from PT to maximize function and safety to be able to get home safely with available assist of family and friends.     Follow Up Recommendations No PT follow up;Other (comment) (may decide based of progress to have HHPT see her after all)    Equipment Recommendations  Rolling walker with 5" wheels;3in1 (PT)    Recommendations for Other Services       Precautions / Restrictions Precautions Precautions: Back Precaution Booklet Issued: Yes (comment) Precaution Comments: provided back handout and reviewed in detail Required Braces or Orthoses: Spinal Brace Spinal Brace: Thoracolumbosacral orthotic;Applied in sitting position      Mobility  Bed Mobility Overal bed mobility: Needs Assistance Bed Mobility: Sit to Sidelying         Sit to sidelying: Min assist General bed mobility comments: cued for best bed mobility technque and need for logroll  Transfers Overall transfer level: Needs assistance Equipment used: Rolling walker (2 wheeled) Transfers: Sit to/from Stand Sit to Stand: Min assist         General transfer comment: cues for hand placement/ transfer safety  Ambulation/Gait Ambulation/Gait assistance: Min assist Ambulation Distance (Feet): 130 Feet Assistive device: Rolling walker (2 wheeled) Gait Pattern/deviations: Step-through pattern     General Gait Details: slower and guarded, cues for staying closer to [employer] Mobility    Modified Rankin (Stroke Patients Only)       Balance Overall balance assessment: No apparent balance deficits (not formally  assessed)                                           Pertinent Vitals/Pain Pain Assessment: 0-10 Pain Score: 9  Pain Location: back Pain Intervention(s): Repositioned    Home Living Family/patient expects to be discharged to:: Private residence Living Arrangements: Alone Available Help at Discharge: Family;Friend(s);Available PRN/intermittently (son, brother and friend) Type of Home: House Home Access: Level entry     Home Layout: Laundry or work area in basement;Two level;Able to live on main level with bedroom/bathroom Home Equipment: Crutches      Prior Function Level of Independence: Independent               Hand Dominance   Dominant Hand: Right    Extremity/Trunk Assessment   Upper Extremity Assessment: Defer to OT evaluation RUE Deficits / Details: numbness at finger tips     LUE Deficits / Details: numbness at finger tips   Lower Extremity Assessment: Overall WFL for tasks assessed;Generalized weakness      Cervical / Trunk Assessment: Normal  Communication   Communication: No difficulties  Cognition Arousal/Alertness: Awake/alert Behavior During Therapy: WFL for tasks assessed/performed Overall Cognitive Status: Within Functional Limits for tasks assessed                      General Comments General comments (skin integrity, edema, etc.): reinforced all back care/precautions, log roll/transitions to/from sit, lifting restrictions, general bracing issues and progression of  activity.    Exercises        Assessment/Plan    PT Assessment Patient needs continued PT services  PT Diagnosis Acute pain   PT Problem List Decreased strength;Decreased activity tolerance;Decreased mobility;Decreased knowledge of use of DME;Decreased knowledge of precautions;Pain  PT Treatment Interventions DME instruction;Gait training;Stair training;Functional mobility training;Therapeutic activities;Patient/family education   PT Goals  (Current goals can be found in the Care Plan section) Acute Rehab PT Goals Patient Stated Goal: be able to do for myself eventually PT Goal Formulation: With patient Time For Goal Achievement: 08/11/14 Potential to Achieve Goals: Good    Frequency Min 5X/week   Barriers to discharge Decreased caregiver support      Co-evaluation               End of Session Equipment Utilized During Treatment: Back brace Activity Tolerance: Patient tolerated treatment well Patient left: in bed;with call bell/phone within reach Nurse Communication: Mobility status         Time: 4268-3419 PT Time Calculation (min): 19 min   Charges:   PT Evaluation $Initial PT Evaluation Tier I: 1 Procedure PT Treatments $Gait Training: 8-22 mins   PT G Codes:          Najah Liverman, Tessie Fass 08/04/2014, 1:10 PM 08/04/2014  Donnella Sham, PT [phone]  (pager)

## 2014-08-05 ENCOUNTER — Encounter (HOSPITAL_COMMUNITY): Payer: Self-pay | Admitting: Neurosurgery

## 2014-08-05 LAB — CBC WITH DIFFERENTIAL/PLATELET
Basophils Absolute: 0 10*3/uL (ref 0.0–0.1)
Basophils Relative: 0 % (ref 0–1)
Eosinophils Absolute: 0 10*3/uL (ref 0.0–0.7)
Eosinophils Relative: 0 % (ref 0–5)
HCT: 20.4 % — ABNORMAL LOW (ref 36.0–46.0)
Hemoglobin: 7.1 g/dL — ABNORMAL LOW (ref 12.0–15.0)
Lymphocytes Relative: 17 % (ref 12–46)
Lymphs Abs: 1.2 10*3/uL (ref 0.7–4.0)
MCH: 32.3 pg (ref 26.0–34.0)
MCHC: 34.8 g/dL (ref 30.0–36.0)
MCV: 92.7 fL (ref 78.0–100.0)
Monocytes Absolute: 0.5 10*3/uL (ref 0.1–1.0)
Monocytes Relative: 8 % (ref 3–12)
Neutro Abs: 5.4 10*3/uL (ref 1.7–7.7)
Neutrophils Relative %: 75 % (ref 43–77)
Platelets: 148 10*3/uL — ABNORMAL LOW (ref 150–400)
RBC: 2.2 MIL/uL — ABNORMAL LOW (ref 3.87–5.11)
RDW: 13.1 % (ref 11.5–15.5)
WBC: 7.1 10*3/uL (ref 4.0–10.5)

## 2014-08-05 MED ORDER — SODIUM CHLORIDE 0.9 % IV BOLUS (SEPSIS)
500.0000 mL | Freq: Once | INTRAVENOUS | Status: AC
  Administered 2014-08-05: 500 mL via INTRAVENOUS

## 2014-08-05 NOTE — Care Management Note (Addendum)
  Page 1 of 1   08/08/2014     11:49:37 AM CARE MANAGEMENT NOTE 08/08/2014  Patient:  Virginia Rose, Virginia Rose   Account Number:  [id number]  Date Initiated:  08/05/2014  Documentation initiated by:  Lorne Skeens  Subjective/Objective Assessment:   Patient was admitted with lumbar burst fracture, posterior lateral fusion.  Lives at home alone.     Action/Plan:   Will follow for discharge needs pending PT/OT evals and physician orders.   Anticipated DC Date:  08/08/2014   Anticipated DC Plan:  [place]  CM consult      Choice offered to / List presented to:     DME arranged  3-N-1  Vassie Moselle      DME agency  [place].        Status of service:  Completed, signed off Medicare Important Message given?  YES (If response is "NO", the following Medicare IM given date fields will be blank) Date Medicare IM given:  08/05/2014 Medicare IM given by:  Lorne Skeens Date Additional Medicare IM given:  08/08/2014 Additional Medicare IM given by:  Lorne Skeens  Discharge Disposition:  HOME/SELF CARE  Per UR Regulation:  Reviewed for med. necessity/level of care/duration of stay  If discussed at [place] of Stay Meetings, dates discussed:    Comments:  08/08/14 [place], MSN, CM- Additional Medicare IM letter provided. DME has been delivered by [facility]   08/05/14 [place] RN, MSN, CM- Medicare IM letter provided.

## 2014-08-05 NOTE — Progress Notes (Addendum)
Physical Therapy Treatment Patient Details Name: DIM MEISINGER MRN: [id number] DOB: July 19, 1946 Today's Date: 08/05/2014    History of Present Illness 68 yo female s/p T10- L4 with TLSO    PT Comments    Progressing slowly due to the amount of pain she is experiencing  Follow Up Recommendations  No PT follow up;Other (comment) (may add HHPT if pain and slow progress continues)     Equipment Recommendations  Rolling walker with 5" wheels;3in1 (PT)    Recommendations for Other Services       Precautions / Restrictions Precautions Precautions: Back Precaution Booklet Issued: Yes (comment) Required Braces or Orthoses: Spinal Brace Spinal Brace: Thoracolumbosacral orthotic;Applied in sitting position Restrictions Weight Bearing Restrictions: No    Mobility  Bed Mobility Overal bed mobility: Needs Assistance Bed Mobility: Rolling;Sidelying to Sit Rolling: Min assist Sidelying to sit: Min assist       General bed mobility comments: cues for safe technique, sequencing  Transfers Overall transfer level: Needs assistance Equipment used: Rolling walker (2 wheeled) Transfers: Sit to/from Stand Sit to Stand: Min assist         General transfer comment: cues for hand placement  Ambulation/Gait Ambulation/Gait assistance: Min assist Ambulation Distance (Feet): 250 Feet Assistive device: Rolling walker (2 wheeled) Gait Pattern/deviations: Step-through pattern     General Gait Details: progressively steadier, but guarded due to pain   Stairs            Wheelchair Mobility    Modified Rankin (Stroke Patients Only)       Balance Overall balance assessment: Needs assistance Sitting-balance support: No upper extremity supported Sitting balance-Leahy Scale: Fair Sitting balance - Comments: has difficulty managing holding brace while caregiver tightens brace.     Standing balance-Leahy Scale: Fair                      Cognition  Arousal/Alertness: Awake/alert Behavior During Therapy: WFL for tasks assessed/performed Overall Cognitive Status: Within Functional Limits for tasks assessed                      Exercises      General Comments General comments (skin integrity, edema, etc.): reinforced all back care/precautions, log roll/transitions to/from sit, lifting restrictions, general bracing issues and progression of activity.      Pertinent Vitals/Pain Pain Assessment: No/denies pain Pain Score: 5  Pain Location: left buttock and lower flank spasms Pain Descriptors / Indicators: Cramping Pain Intervention(s): Limited activity within patient's tolerance;Premedicated before session    Home Living                      Prior Function            PT Goals (current goals can now be found in the care plan section) Acute Rehab PT Goals Patient Stated Goal: to be back to doing for myself PT Goal Formulation: With patient Time For Goal Achievement: 08/11/14 Potential to Achieve Goals: Good Progress towards PT goals: Progressing toward goals    Frequency  Min 5X/week    PT Plan Current plan remains appropriate    Co-evaluation             End of Session Equipment Utilized During Treatment: Back brace Activity Tolerance: Patient tolerated treatment well;Patient limited by pain Patient left: in chair;with call bell/phone within reach     Time: 1324-1350 PT Time Calculation (min): 26 min  Charges:  $Gait Training: 8-22 mins $Therapeutic Activity:  8-22 mins                    G Codes:      Daylyn Azbill, Tessie Fass 08/05/2014, 2:54 PM 08/05/2014  Donnella Sham, PT [phone]  (pager)

## 2014-08-05 NOTE — Progress Notes (Signed)
Subjective: Patient reports ddoing well except for muscle spasms on the left side her back no leg pain no new numbness or tingling  Objective: Vital signs in last 24 hours: Temp:  [97.4 F (36.3 C)-98.4 F (36.9 C)] 98.3 F (36.8 C) (09/25 0513) Pulse Rate:  [85-98] 85 (09/25 0513) Resp:  [18] 18 (09/25 0513) BP: (98-115)/(42-50) 98/47 mmHg (09/25 0513) SpO2:  [98 %-100 %] 98 % (09/25 0513)  Intake/Output from previous day: 09/24 0701 - 09/25 0700 In: 390 [P.O.:240] Out: 1100 [Urine:1100] Intake/Output this shift:    strength out of 5 wound clean dry and intact  Lab Results: No results found for this basename: WBC, HGB, HCT, PLT,  in the last 72 hours BMET No results found for this basename: NA, K, CL, CO2, GLUCOSE, BUN, CREATININE, CALCIUM,  in the last 72 hours  Studies/Results: Dg Thoracolumbar Spine  08/03/2014   CLINICAL DATA:  Compression fracture related to moving a piano. Progressive deformity.  EXAM: DG C-ARM 61-120 MIN; THORACOLUMBAR SPINE - 2 VIEW  : FLUOROSCOPY TIME:  Please refer to operative report.  COMPARISON:  Multiple priors.  FINDINGS: C-arm films document spinal fusion from T10 through L4, with stabilization to the previous L4-5 instrumentation. Pedicle screws have been placed above and below the L1 compression deformity.  IMPRESSION: As above.   Electronically Signed   By: Rolla Flatten M.D.   On: 08/03/2014 12:39   Dg C-arm 1-60 Min  08/03/2014   CLINICAL DATA:  Compression fracture related to moving a piano. Progressive deformity.  EXAM: DG C-ARM 61-120 MIN; THORACOLUMBAR SPINE - 2 VIEW  : FLUOROSCOPY TIME:  Please refer to operative report.  COMPARISON:  Multiple priors.  FINDINGS: C-arm films document spinal fusion from T10 through L4, with stabilization to the previous L4-5 instrumentation. Pedicle screws have been placed above and below the L1 compression deformity.  IMPRESSION: As above.   Electronically Signed   By: Rolla Flatten M.D.   On: 08/03/2014 12:39     Assessment/Plan: Continue to mobilize with physical and occupational therapy in her brace. 500 cc bolus to help her blood pressure and I will check a CBC   LOS: 2 days     Tamorah Hada P 08/05/2014, 8:22 AM

## 2014-08-05 NOTE — Progress Notes (Signed)
Patient's family stated that she has not been eating well prior to surgery, very poor appetite today.  Requested nutritional consult.  Pt is lactose intolerant.

## 2014-08-05 NOTE — Progress Notes (Signed)
Occupational Therapy Treatment Patient Details Name: Virginia Rose MRN: [id number] DOB: 1946-07-06 Today's Date: 08/05/2014    History of present illness 68 yo female s/p T10- L4 with TLSO   OT comments  Pt progressing this session with don of brace but continues to require (A). Pt demonstrates toilet transfer and sink level grooming. PT reports pain in Lt buttock area. BP 113/43 receiving fluids at this time.   Follow Up Recommendations  No OT follow up    Equipment Recommendations  None recommended by OT    Recommendations for Other Services      Precautions / Restrictions Precautions Precautions: Back Required Braces or Orthoses: Spinal Brace Spinal Brace: Thoracolumbosacral orthotic;Applied in sitting position Restrictions Weight Bearing Restrictions: No       Mobility Bed Mobility Overal bed mobility: Needs Assistance Bed Mobility: Supine to Sit;Rolling;Sidelying to Sit Rolling: Min assist Sidelying to sit: Min assist Supine to sit: Min assist     General bed mobility comments: cues for sequence and back precautions  Transfers Overall transfer level: Needs assistance Equipment used: Rolling walker (2 wheeled) Transfers: Sit to/from Stand Sit to Stand: Min assist         General transfer comment: cues for hand placement    Balance Overall balance assessment: Needs assistance         Standing balance support: No upper extremity supported;During functional activity Standing balance-Leahy Scale: Fair                     ADL Overall ADL's : Needs assistance/impaired Eating/Feeding: Modified independent;Sitting   Grooming: Wash/dry hands;Oral care;Min guard;Standing Grooming Details (indicate cue type and reason): cues to avoid bending         Upper Body Dressing : Moderate assistance;Sitting Upper Body Dressing Details (indicate cue type and reason): don tlso able to thread brace today     Toilet Transfer: Min guard;Ambulation    Toileting- Clothing Manipulation and Hygiene: Min guard;Sit to/from stand Toileting - Clothing Manipulation Details (indicate cue type and reason): able to squat and perform front peri care     Functional mobility during ADLs: Min guard;Rolling walker General ADL Comments: pt educated on don TLSO bed mobility ,toilet transfer and grooming task. pt cued to relax shoulders during sitting and decr bracing to decr pain. BP at end of session 113/43      Vision                     Perception     Praxis      Cognition   Behavior During Therapy: [facility] for tasks assessed/performed Overall Cognitive Status: Within Functional Limits for tasks assessed                       Extremity/Trunk Assessment               Exercises     Shoulder Instructions       General Comments      Pertinent Vitals/ Pain       Pain Assessment: 0-10 Pain Score: 5  Pain Location: lt buttock Pain Descriptors / Indicators: Cramping Pain Intervention(s): Repositioned;Premedicated before session  Home Living                                          Prior Functioning/Environment  Frequency Min 2X/week     Progress Toward Goals  OT Goals(current goals can now be found in the care plan section)  Progress towards OT goals: Progressing toward goals  Acute Rehab OT Goals Patient Stated Goal: to be back to doing for myself OT Goal Formulation: With patient Time For Goal Achievement: 08/18/14 Potential to Achieve Goals: Good ADL Goals Pt Will Perform Grooming: with set-up;standing Pt Will Perform Upper Body Bathing: with set-up;sitting Pt Will Perform Lower Body Bathing: with set-up;sit to/from stand Pt Will Transfer to Toilet: with set-up;ambulating;regular height toilet Additional ADL Goal #1: Pt will don doff TLSO mod I  Plan Discharge plan remains appropriate    Co-evaluation                 End of Session Equipment Utilized  During Treatment: Gait belt;Rolling walker   Activity Tolerance Patient tolerated treatment well   Patient Left in chair;with call bell/phone within reach;with chair alarm set   Nurse Communication Mobility status;Precautions        Time: 6389-3734 OT Time Calculation (min): 34 min  Charges: OT General Charges $OT Visit: 1 Procedure OT Treatments $Self Care/Home Management : 23-37 mins  Peri Maris 08/05/2014, 10:22 AM Pager: [phone]

## 2014-08-06 LAB — PREPARE RBC (CROSSMATCH)

## 2014-08-06 MED ORDER — SODIUM CHLORIDE 0.9 % IV SOLN: Freq: Once | INTRAVENOUS | Status: AC

## 2014-08-06 MED ORDER — BOOST / RESOURCE BREEZE PO LIQD
1.0000 | Freq: Two times a day (BID) | ORAL | Status: DC
  Administered 2014-08-07 (×2): 1 via ORAL

## 2014-08-06 NOTE — Progress Notes (Signed)
Occupational Therapy Treatment Patient Details Name: Virginia Rose MRN: [id number] DOB: [birth year] Today's Date: 08/06/2014    History of present illness 68 yo female s/p T10- L4 with TLSO   OT comments  Pt progressing. Education provided during session.   Follow Up Recommendations  No OT follow up    Equipment Recommendations  3 in 1 bedside comode    Recommendations for Other Services      Precautions / Restrictions Precautions Precautions: Back Precaution Comments: Reviewed precautions with pt Required Braces or Orthoses: Spinal Brace Spinal Brace: Thoracolumbosacral orthotic;Applied in sitting position Restrictions Weight Bearing Restrictions: No       Mobility Bed Mobility Overal bed mobility: Needs Assistance Bed Mobility: Rolling;Sidelying to Sit;Sit to Sidelying Rolling: Supervision;Min guard Sidelying to sit: Supervision     Sit to sidelying: Supervision General bed mobility comments: cues for technique.  Transfers Overall transfer level: Needs assistance Equipment used: Rolling walker (2 wheeled) Transfers: Sit to/from Stand Sit to Stand: Min guard;Supervision         General transfer comment: cues for technique/precautions.    Balance                                   ADL Overall ADL's : Needs assistance/impaired     Grooming: Wash/dry face;Oral care;Brushing hair;Set up;Supervision/safety;Standing Grooming Details (indicate cue type and reason): cues for precautions         Upper Body Dressing : Sitting;Standing;Moderate assistance Upper Body Dressing Details (indicate cue type and reason): TLSO Lower Body Dressing: Set up;Supervision/safety;Sit to/from stand   Toilet Transfer: Supervision/safety;Ambulation;RW (chair)           Functional mobility during ADLs: Supervision/safety;Rolling walker General ADL Comments: Discussed use of cup for teeth care and placement of grooming items to avoid breaking precautions.  Educated on safety tips for home (use of bag on walker, safe shoewear, rugs). Recommended sitting for LB ADLs and discussed what pt could use for shower chair. Practiced donning/doffing brace and educated on brace wear (OT assisted with straps and to help adjust it while standing). Discussed safe technique for tub transfer.      Vision                     Perception     Praxis      Cognition   Behavior During Therapy: WFL for tasks assessed/performed Overall Cognitive Status: Within Functional Limits for tasks assessed                       Extremity/Trunk Assessment               Exercises     Shoulder Instructions       General Comments      Pertinent Vitals/ Pain       Pain Assessment: 0-10 Pain Score: 2  Pain Location: back Pain Descriptors / Indicators: Spasm Pain Intervention(s): Monitored during session;Repositioned  Home Living                                          Prior Functioning/Environment              Frequency Min 2X/week     Progress Toward Goals  OT Goals(current goals can now be found in the care plan section)  Progress towards OT goals: Progressing toward goals  Acute Rehab OT Goals Patient Stated Goal: get back to doing things for herself OT Goal Formulation: With patient Time For Goal Achievement: 08/18/14 Potential to Achieve Goals: Good ADL Goals Pt Will Perform Grooming: with set-up;standing Pt Will Perform Upper Body Bathing: with set-up;sitting Pt Will Perform Lower Body Bathing: with set-up;sit to/from stand Pt Will Transfer to Toilet: with set-up;ambulating;regular height toilet Additional ADL Goal #1: Pt will don doff TLSO mod I  Plan Discharge plan remains appropriate    Co-evaluation                 End of Session Equipment Utilized During Treatment: Gait belt;Rolling walker;Back brace   Activity Tolerance Patient tolerated treatment well   Patient Left in bed;with  call bell/phone within reach;with bed alarm set   Nurse Communication  saw pt ambulating        Time: 0375-4360 OT Time Calculation (min): 36 min  Charges: OT General Charges $OT Visit: 1 Procedure OT Treatments $Self Care/Home Management : 23-37 mins  Benito Mccreedy OTR/L [phone] 08/06/2014, 3:32 PM

## 2014-08-06 NOTE — Progress Notes (Signed)
PT Cancellation Note  Patient Details Name: Virginia Rose MRN: [id number] DOB: 1946-09-18   Cancelled Treatment:    Reason Eval/Treat Not Completed: Patient declined, just returned to bed with nursing after sitting up for an hour and walking to bathroom. Want's to let her pain medicine work and rest while the unit of PRBC's completes. Will follow up tomorrow.   Willow Ora 08/06/2014, 1:20 PM  Willow Ora, PTA Office- [phone]

## 2014-08-06 NOTE — Progress Notes (Signed)
INITIAL NUTRITION ASSESSMENT  DOCUMENTATION CODES Per approved criteria  -Moderate malnutrition in the context of acute illness or injury  Pt meets criteria for moderate MALNUTRITION in the context of acute injury as evidenced by moderate muscle and subcutaneous fat depletion and energy intake <75% for > 7 days.  INTERVENTION:  Provide Resource Breeze po BID, each supplement provides 250 kcal and 9 grams of protein  Dannon Yogurt Protein shakes from home, provided by son  Encourage PO intake (especially for breakfast)  Will continue to monitor  NUTRITION DIAGNOSIS: Increased nutrient needs related to wound healing, recent surgery as evidenced by estimated nutritional needs.   Goal: Pt to meet >/= 90% of their estimated nutrition needs   Monitor:  PO and supplemental intake, weight, labs, I/O's  Reason for Assessment: Consult for poor PO intake  Admitting Dx: L1 burst fracture and degenerative lumbar scoliosis with instability  ASSESSMENT: 68 year old female with severe osteoporosis who was moving a piano from against the wall and experienced immediate pop and pain in her back patient was worked up and noted to have an L1 burst fracture. Patient's family stated that she has not been eating well prior to surgery, very poor appetite today.   S/P 9/23: THORACIC TEN-ELEVEN,THORACIC ELEVEN-TWELVE,THORACIC TWELVE-LUMBAR ONE,LUMBARTWO-THREE,LUMBAR THREE-FOUR POSTERIOR LATERAL FUSION   Pt and son were in room during visit. Pt's son states that pt is "a very picky eater and has been as long as he can remember". Pt is lactose intolerant so she cannot tolerate regular Ensure or Boost supplements.   Pt reports wt loss but weight history documentation does not show any weight loss. Pt has had low appetite for the past couple of days. Pt states that she has never been a big eater.  Dietary Recall:  B: she normally skips, does not like meats with breakfast  L: may have a 1/2 of a pb  sandwich (son states that they recently made her add those into her diet) D: main meal of the day, pork chop, potatoes or vegetables Recently pt reports having some taste changes ("everything has a metallic taste to it"). She is particularly avoiding meats for this reason. Pt does not like strawberry flavors or melon.  Pt's son stated that he is going to bring in Dannon Yogurt protein shakes for her to snack on, these provide 12 g protein and 130 kcals per bottle.  RD suggested adding Resource Breeze supplements as a snack in between meals. Provided a orange flavor supplement for her to try. She stated that she can tolerate it and wants one in between lunch and dinner and one at bedtime.  Nutrition Focused Physical Exam:  Subcutaneous Fat:  Orbital Region: WNL Upper Arm Region: moderate depletion Thoracic and Lumbar Region: NA  Muscle:  Temple Region: WNL Clavicle Bone Region: NA Clavicle and Acromion Bone Region: NA Scapular Bone Region: NA -injured Dorsal Hand: moderate depletion Patellar Region: NA Anterior Thigh Region: NA Posterior Calf Region: NA  Edema: no LE edema    Labs reviewed: Low Na  Height: Ht Readings from Last 1 Encounters:  08/03/14 5' 3.75" (1.619 m)    Weight: Wt Readings from Last 1 Encounters:  08/03/14 106 lb 12.8 oz (48.444 kg)    Ideal Body Weight: 120 lb  % Ideal Body Weight: 88%  Wt Readings from Last 10 Encounters:  08/03/14 106 lb 12.8 oz (48.444 kg)  08/03/14 106 lb 12.8 oz (48.444 kg)  07/27/14 97 lb 3.2 oz (44.09 kg)  09/29/12 104 lb 15.7  oz (47.62 kg)  09/29/12 104 lb 15.7 oz (47.62 kg)  09/22/12 105 lb (47.628 kg)    Usual Body Weight: 100-103 lb per pt and family  % Usual Body Weight: 106%  BMI:  Body mass index is 18.48 kg/(m^2).  Estimated Nutritional Needs: Kcal: 1500-1600 Protein: 75-85g Fluid: 1.5L/day  Skin: surgical incision, wound on posterior  Diet Order: Cardiac  EDUCATION NEEDS: -No education needs  identified at this time   Intake/Output Summary (Last 24 hours) at 08/06/14 0940 Last data filed at 08/06/14 0700  Gross per 24 hour  Intake    240 ml  Output    477 ml  Net   -237 ml    Last BM: 9/22  Labs:  No results found for this basename: NA, K, CL, CO2, BUN, CREATININE, CALCIUM, MG, PHOS, GLUCOSE,  in the last 168 hours  CBG (last 3)  No results found for this basename: GLUCAP,  in the last 72 hours  Scheduled Meds: . sodium chloride   Intravenous Once  .  ceFAZolin (ANCEF) IV  1 g Intravenous Q8H  . docusate sodium  100 mg Oral BID  . polyvinyl alcohol  1 drop Both Eyes Daily  . senna  1 tablet Oral BID  . simvastatin  40 mg Oral QPM  . sodium chloride  3 mL Intravenous Q12H    Continuous Infusions: . sodium chloride      Past Medical History  Diagnosis Date  . COPD (chronic obstructive pulmonary disease)   . Hypercholesteremia   . Osteoporosis   . Arthritis   . Shortness of breath     with exertion    Past Surgical History  Procedure Laterality Date  . Abdominal hysterectomy    . Appendectomy    . Eye surgery      bilateral cataract removal  . Colonoscopy w/ polypectomy    . Posterior lumbar fusion 4 level N/A 08/03/2014    Procedure: THORACIC TEN-ELEVEN,THORACIC ELEVEN-TWELVE,THORACIC TWELVE-LUMBAR ONE,LUMBARTWO-THREE,LUMBAR THREE-FOUR POSTERIOR LATERAL FUSION;  Surgeon: Elaina Hoops, MD;  Location: [facility];  Service: Neurosurgery;  Laterality: N/A;  Site includes thoracic and lumbar spine    Clayton Bibles, MS, RD, [place] Pager: [phone]

## 2014-08-06 NOTE — Progress Notes (Signed)
Subjective: Patient reports overall she's doing okay but still feels very weak had a lot of muscle spasm in her back which occasional contour quads but otherwise no radicular pain and no new numbness tingling or legs  Objective: Vital signs in last 24 hours: Temp:  [97.5 F (36.4 C)-98.6 F (37 C)] 98.1 F (36.7 C) (09/26 0653) Pulse Rate:  [87-98] 87 (09/26 0653) Resp:  [18-20] 20 (09/26 0653) BP: (108-130)/(40-63) 121/54 mmHg (09/26 0653) SpO2:  [97 %-100 %] 97 % (09/26 0653)  Intake/Output from previous day: 09/25 0701 - 09/26 0700 In: 480 [P.O.:480] Out: 617 [Urine:327; Drains:290] Intake/Output this shift:    strength out of 5 wound clean dry and intact  Lab Results:  Recent Labs  08/05/14 0901  WBC 7.1  HGB 7.1*  HCT 20.4*  PLT 148*   BMET No results found for this basename: NA, K, CL, CO2, GLUCOSE, BUN, CREATININE, CALCIUM,  in the last 72 hours  Studies/Results: No results found.  Assessment/Plan: Continue to progressively mobilize with physical and occupational therapy. Hematocrit and hemoglobin very low yesterday patient is not tachycardic but symptomatically he is getting dizzy when she gets up and very weak. I recommend transfusion she has agreed we will set that up for 2 units.   LOS: 3 days    Megyn Leng P 08/06/2014, 8:54 AM

## 2014-08-06 NOTE — Clinical Social Work Note (Signed)
Clinical Social Worker received referral for possible ST-SNF placement.  Chart reviewed.  PT/OT recommending no follow up at this time but potential home health needs if patient progresses slowly.  Spoke with RN Case Manager who will follow up with patient to discuss home health needs.    CSW signing off - please re consult if social work needs arise.  Barbette Or, [place]

## 2014-08-06 NOTE — Progress Notes (Signed)
Note/Chart Reviewed. Agree with assessment and intervention.  Pryor Ochoa RD, LDN Inpatient Clinical Dietitian Pager: [phone] After Hours Pager: [phone]

## 2014-08-07 LAB — TYPE AND SCREEN
ABO/RH(D): O POS
Antibody Screen: NEGATIVE
Unit division: 0
Unit division: 0

## 2014-08-07 MED ORDER — POLYETHYLENE GLYCOL 3350 17 G PO PACK
17.0000 g | PACK | Freq: Every day | ORAL | Status: DC
Start: 2014-08-08 — End: 2014-08-08
  Administered 2014-08-08: 17 g via ORAL
  Filled 2014-08-07: qty 1

## 2014-08-07 NOTE — Progress Notes (Signed)
Physical Therapy Treatment Patient Details Name: Virginia Rose MRN: [id number] DOB: [birth year] Today's Date: 08/07/2014    History of Present Illness 68 yo female s/p T10- L4 fusion due to L1 burst fracture    PT Comments    Pt presents with less pain today, feels more independent and is hopeful to d/c home in the next 1-2 days.  COntinue to feel pt will NOT need PT follow up at d/c, however recommend pt discuss OPPT (to increase muscle mass/osteoporosis care) with MD once back surgery is healed, pt agreeable.     Follow Up Recommendations  No PT follow up     Equipment Recommendations  Rolling walker with 5" wheels;3in1 (PT)    Recommendations for Other Services       Precautions / Restrictions Precautions Precautions: Back Precaution Comments: emphasized risk of breaking precautions... pt Indep with maintaining Required Braces or Orthoses: Spinal Brace Spinal Brace: Thoracolumbosacral orthotic;Applied in sitting position Restrictions Weight Bearing Restrictions: No    Mobility  Bed Mobility Overal bed mobility: Modified Independent             General bed mobility comments: pt independently performs bed mobility without cues and safely. including repositiong HOB to falt  Transfers Overall transfer level: Needs assistance Equipment used: Rolling walker (2 wheeled) Transfers: Sit to/from Stand Sit to Stand: Supervision         General transfer comment: observed for safe performance with cues for optimal positioning to minimize risk to back surgery and to incr use of quads for ascent/descent  Ambulation/Gait Ambulation/Gait assistance: Supervision Ambulation Distance (Feet): 360 Feet (around unit perimeter) Assistive device: Rolling walker (2 wheeled) Gait Pattern/deviations: Step-to pattern Gait velocity: unmeasured Gait velocity interpretation: at or above normal speed for age/gender General Gait Details: increased postural alignment without cues; slow  and steady; attempted without RW and pt reports incr left side spasm/soreness so used RW contiunously   Stairs            Wheelchair Mobility    Modified Rankin (Stroke Patients Only)       Balance                                    Cognition Arousal/Alertness: Awake/alert Behavior During Therapy: WFL for tasks assessed/performed Overall Cognitive Status: Within Functional Limits for tasks assessed                      Exercises      General Comments General comments (skin integrity, edema, etc.): drain pulled, dressing dry except for darker drainage at top of dressing; no pain      Pertinent Vitals/Pain Pain Assessment: 0-10 Pain Score: 4  Pain Location: thighs, back Pain Descriptors / Indicators: Cramping;Aching;Sore;Spasm Pain Intervention(s): Limited activity within patient's tolerance;Premedicated before session;Repositioned    Home Living                      Prior Function            PT Goals (current goals can now be found in the care plan section) Acute Rehab PT Goals PT Goal Formulation: With patient Time For Goal Achievement: 08/11/14 Potential to Achieve Goals: Good Progress towards PT goals: Progressing toward goals    Frequency  Min 5X/week    PT Plan Current plan remains appropriate    Co-evaluation  End of Session Equipment Utilized During Treatment: Back brace Activity Tolerance: Patient tolerated treatment well Patient left: in chair;with call bell/phone within reach     Time: 8337-4451 PT Time Calculation (min): 35 min  Charges:  $Gait Training: 8-22 mins $Therapeutic Activity: 8-22 mins                    G Codes:      [name] 08/07/2014, 2:22 PM

## 2014-08-07 NOTE — Progress Notes (Signed)
Patient ID: Virginia Rose, female   DOB: 10-Aug-1946, 68 y.o.   MRN: [id number] Vital signs are stable. Patient has been ambulating modestly. Pain under moderate control Hemovac drain with minimal output, will discontinue today Further progression with physical therapy, a full for discharge soon.

## 2014-08-08 DIAGNOSIS — E440 Moderate protein-calorie malnutrition: Secondary | ICD-10-CM | POA: Insufficient documentation

## 2014-08-08 LAB — CBC WITH DIFFERENTIAL/PLATELET
Basophils Absolute: 0 10*3/uL (ref 0.0–0.1)
Basophils Relative: 0 % (ref 0–1)
Eosinophils Absolute: 0.2 10*3/uL (ref 0.0–0.7)
Eosinophils Relative: 3 % (ref 0–5)
HCT: 31.5 % — ABNORMAL LOW (ref 36.0–46.0)
Hemoglobin: 10.9 g/dL — ABNORMAL LOW (ref 12.0–15.0)
Lymphocytes Relative: 23 % (ref 12–46)
Lymphs Abs: 1.6 10*3/uL (ref 0.7–4.0)
MCH: 31.1 pg (ref 26.0–34.0)
MCHC: 34.6 g/dL (ref 30.0–36.0)
MCV: 89.7 fL (ref 78.0–100.0)
Monocytes Absolute: 0.6 10*3/uL (ref 0.1–1.0)
Monocytes Relative: 9 % (ref 3–12)
Neutro Abs: 4.7 10*3/uL (ref 1.7–7.7)
Neutrophils Relative %: 65 % (ref 43–77)
Platelets: 191 10*3/uL (ref 150–400)
RBC: 3.51 MIL/uL — ABNORMAL LOW (ref 3.87–5.11)
RDW: 15 % (ref 11.5–15.5)
WBC: 7.2 10*3/uL (ref 4.0–10.5)

## 2014-08-08 MED ORDER — OXYCODONE-ACETAMINOPHEN 5-325 MG PO TABS: 1.0000 | ORAL_TABLET | ORAL | Status: DC | PRN

## 2014-08-08 MED ORDER — CYCLOBENZAPRINE HCL 10 MG PO TABS: 10.0000 mg | ORAL_TABLET | Freq: Three times a day (TID) | ORAL | Status: DC | PRN

## 2014-08-08 NOTE — Discharge Instructions (Signed)
No lifting no bending no twisting no driving a riding a car unless she's come back and forth to see me.

## 2014-08-08 NOTE — Progress Notes (Signed)
Pt is being discharged home with family. Information not limited to  discharged instruction , meds administration, precaution and sign of infection were provided to the patient.

## 2014-08-08 NOTE — Progress Notes (Signed)
PT Cancellation Note  Patient Details Name: Virginia Rose MRN: [id number] DOB: 1946-04-09   Cancelled Treatment:    Reason Eval/Treat Not Completed: Patient declined, no reason specified  Pt politely declines physical therapy. States she was able to ambulate around the unit this AM and has no further questions regarding mobility at this time.   [place], [place]  [name] 08/08/2014, 11:05 AM

## 2014-08-08 NOTE — Progress Notes (Signed)
Patient ID: Virginia Rose, female   DOB: Mar 26, 1946, 68 y.o.   MRN: [id number] Doing well plan discharge home

## 2014-08-08 NOTE — Discharge Summary (Signed)
  Physician Discharge Summary  Patient ID: Virginia Rose MRN: [id number] DOB/AGE: [birth year] 68 y.o.  Admit date: 08/03/2014 Discharge date: 08/08/2014  Admission Diagnoses:L1 burst fracture  Discharge Diagnoses: same Active Problems:   Lumbar burst fracture   Malnutrition of moderate degree   Discharged Condition: good  Hospital Course: patient admitted hospital as an early morning admission underwent a T10-L4 stabilization procedure and postoperatively very well recovered in the floor on the floor she's convalescing well had a lot of pain progressively mobilized over the several days following surgery she was noted to have a very low hematocrit and hemoglobin was transfuse 2 units of packed cells this area significantly improved her strength and less a dizziness. So by postop day 5 she was stable to go home she'll be discharged on cyclobenzaprine and oxycodone and she was instructed to take over-the-counter iron at home.  Consults: Significant Diagnostic Studies: Treatments:T10-L4 stabilization procedure Discharge Exam: Blood pressure 126/51, pulse 82, temperature 98.2 F (36.8 C), temperature source Oral, resp. rate 18, height 5' 3.75" (1.619 m), weight 48.444 kg (106 lb 12.8 oz), SpO2 100.00%. Strength out of 5 wound clean dry and intact  Disposition: home     Medication List         alendronate 70 MG tablet  Commonly known as:  FOSAMAX  Take 70 mg by mouth every 7 (seven) days. Take with a full glass of water on an empty stomach. Take on Mondays     cyclobenzaprine 10 MG tablet  Commonly known as:  FLEXERIL  Take 1 tablet (10 mg total) by mouth 3 (three) times daily as needed for muscle spasms.     cyclobenzaprine 10 MG tablet  Commonly known as:  FLEXERIL  Take 1 tablet (10 mg total) by mouth 3 (three) times daily as needed for muscle spasms.     HYDROcodone-acetaminophen 5-325 MG per tablet  Commonly known as:  NORCO/VICODIN  Take 1 tablet by mouth every 6 (six)  hours as needed for moderate pain.     hydroxypropyl methylcellulose / hypromellose 2.5 % ophthalmic solution  Commonly known as:  ISOPTO TEARS / GONIOVISC  Place 1 drop into both eyes daily.     oxyCODONE-acetaminophen 5-325 MG per tablet  Commonly known as:  PERCOCET/ROXICET  Take 1-2 tablets by mouth every 4 (four) hours as needed for moderate pain.     simvastatin 40 MG tablet  Commonly known as:  ZOCOR  Take 40 mg by mouth every evening.           Follow-up Information   Follow up with [facility], MD.   Specialty:  Neurosurgery   Contact information:   [address] [place] [address] [phone]       Signed: Sandra Tellefsen P 08/08/2014, 8:15 AM

## 2014-08-08 NOTE — Progress Notes (Signed)
Occupational Therapy Treatment Patient Details Name: Virginia Rose MRN: [id number] DOB: 10/23/1946 Today's Date: 08/08/2014    History of present illness 68 yo female s/p T10- L4 fusion due to L1 burst fracture   OT comments  Pt moving well. Feel pt is safe to d/c home from OT standpoint.  Follow Up Recommendations  No OT follow up    Equipment Recommendations  3 in 1 bedside comode    Recommendations for Other Services      Precautions / Restrictions Precautions Precautions: Back Precaution Comments: Able to state precautions Required Braces or Orthoses: Spinal Brace Spinal Brace: Thoracolumbosacral orthotic;Applied in sitting position Restrictions Weight Bearing Restrictions: No       Mobility Bed Mobility Overal bed mobility: Modified Independent                Transfers Overall transfer level: Needs assistance Equipment used: Rolling walker (2 wheeled) Transfers: Sit to/from Stand Sit to Stand: Supervision              Balance                                   ADL Overall ADL's : Needs assistance/impaired     Grooming: Wash/dry face;Oral care;Brushing hair;Set up;Supervision/safety;Standing           Upper Body Dressing : Minimal assistance;Sitting   Lower Body Dressing: Set up;Supervision/safety;Sit to/from stand   Toilet Transfer: Supervision/safety;Ambulation;RW;Comfort height toilet;Grab bars   Toileting- Clothing Manipulation and Hygiene: Supervision/safety;Sitting/lateral lean (standing/sitting)       Functional mobility during ADLs: Supervision/safety;Rolling walker General ADL Comments: Pt performed bathing/dressing/grooming. Pt able to don/doff back brace with very minimal assistance. Reviewed safety tips. discussed incorporating precautions into functional task. Cues to have shoulders back and stand more upright. Reviewed back brace information.      Vision                     Perception      Praxis      Cognition   Behavior During Therapy: WFL for tasks assessed/performed Overall Cognitive Status: Within Functional Limits for tasks assessed                       Extremity/Trunk Assessment               Exercises     Shoulder Instructions       General Comments      Pertinent Vitals/ Pain       Pain Assessment: 0-10 Pain Score: 3  Pain Location: back Pain Descriptors / Indicators: Aching;Spasm Pain Intervention(s): Monitored during session  Home Living                                          Prior Functioning/Environment              Frequency Min 2X/week     Progress Toward Goals  OT Goals(current goals can now be found in the care plan section)  Progress towards OT goals: Progressing toward goals  Acute Rehab OT Goals Patient Stated Goal: not stated OT Goal Formulation: With patient Time For Goal Achievement: 08/18/14 Potential to Achieve Goals: Good ADL Goals Pt Will Perform Grooming: with set-up;standing Pt Will Perform Upper Body Bathing: with set-up;sitting Pt Will Perform Lower  Body Bathing: with set-up;sit to/from stand Pt Will Transfer to Toilet: with set-up;ambulating;regular height toilet Additional ADL Goal #1: Pt will don doff TLSO mod I  Plan Discharge plan remains appropriate    Co-evaluation                 End of Session Equipment Utilized During Treatment: Gait belt;Rolling walker;Back brace   Activity Tolerance Patient tolerated treatment well   Patient Left in bed;with call bell/phone within reach   Nurse Communication  (DME needs)        Time: 6153-7943 OT Time Calculation (min): 32 min  Charges: OT General Charges $OT Visit: 1 Procedure OT Treatments $Self Care/Home Management : 23-37 mins  Benito Mccreedy OTR/L [phone] 08/08/2014, 1:02 PM

## 2014-10-20 ENCOUNTER — Other Ambulatory Visit: Payer: Self-pay | Admitting: Family Medicine

## 2014-10-20 DIAGNOSIS — M810 Age-related osteoporosis without current pathological fracture: Secondary | ICD-10-CM

## 2014-12-08 ENCOUNTER — Other Ambulatory Visit: Payer: Self-pay

## 2014-12-08 DIAGNOSIS — Z1231 Encounter for screening mammogram for malignant neoplasm of breast: Secondary | ICD-10-CM

## 2015-01-03 ENCOUNTER — Ambulatory Visit
Admission: RE | Admit: 2015-01-03 | Discharge: 2015-01-03 | Disposition: A | Discharge status: Home / Self Care | Payer: Medicare Other | Source: Ambulatory Visit

## 2015-01-03 ENCOUNTER — Ambulatory Visit
Admission: RE | Admit: 2015-01-03 | Discharge: 2015-01-03 | Disposition: A | Discharge status: Home / Self Care | Payer: Medicare Other | Source: Ambulatory Visit | Attending: Family Medicine | Admitting: Family Medicine

## 2015-01-03 DIAGNOSIS — M810 Age-related osteoporosis without current pathological fracture: Secondary | ICD-10-CM

## 2015-01-03 DIAGNOSIS — Z1231 Encounter for screening mammogram for malignant neoplasm of breast: Secondary | ICD-10-CM

## 2015-01-17 ENCOUNTER — Other Ambulatory Visit: Payer: Self-pay | Admitting: Neurosurgery

## 2015-01-17 DIAGNOSIS — S32011D Stable burst fracture of first lumbar vertebra, subsequent encounter for fracture with routine healing: Secondary | ICD-10-CM

## 2015-01-19 ENCOUNTER — Ambulatory Visit
Admission: RE | Admit: 2015-01-19 | Discharge: 2015-01-19 | Disposition: A | Discharge status: Home / Self Care | Payer: Medicare Other | Source: Ambulatory Visit | Attending: Neurosurgery | Admitting: Neurosurgery

## 2015-01-19 DIAGNOSIS — S32011D Stable burst fracture of first lumbar vertebra, subsequent encounter for fracture with routine healing: Secondary | ICD-10-CM

## 2015-05-31 ENCOUNTER — Other Ambulatory Visit: Payer: Self-pay | Admitting: Gastroenterology

## 2016-01-04 ENCOUNTER — Other Ambulatory Visit: Payer: Self-pay

## 2016-01-04 DIAGNOSIS — Z1231 Encounter for screening mammogram for malignant neoplasm of breast: Secondary | ICD-10-CM

## 2016-01-23 ENCOUNTER — Ambulatory Visit
Admission: RE | Admit: 2016-01-23 | Discharge: 2016-01-23 | Disposition: A | Discharge status: Home / Self Care | Payer: Medicare Other | Source: Ambulatory Visit

## 2016-01-23 DIAGNOSIS — Z1231 Encounter for screening mammogram for malignant neoplasm of breast: Secondary | ICD-10-CM

## 2016-02-06 DIAGNOSIS — H11423 Conjunctival edema, bilateral: Secondary | ICD-10-CM | POA: Diagnosis not present

## 2016-02-06 DIAGNOSIS — H18413 Arcus senilis, bilateral: Secondary | ICD-10-CM | POA: Diagnosis not present

## 2016-02-06 DIAGNOSIS — H11153 Pinguecula, bilateral: Secondary | ICD-10-CM | POA: Diagnosis not present

## 2016-02-06 DIAGNOSIS — Z961 Presence of intraocular lens: Secondary | ICD-10-CM | POA: Diagnosis not present

## 2016-02-06 DIAGNOSIS — H524 Presbyopia: Secondary | ICD-10-CM | POA: Diagnosis not present

## 2016-02-06 DIAGNOSIS — Z9849 Cataract extraction status, unspecified eye: Secondary | ICD-10-CM | POA: Diagnosis not present

## 2016-02-06 DIAGNOSIS — H52223 Regular astigmatism, bilateral: Secondary | ICD-10-CM | POA: Diagnosis not present

## 2016-02-06 DIAGNOSIS — H40013 Open angle with borderline findings, low risk, bilateral: Secondary | ICD-10-CM | POA: Diagnosis not present

## 2016-02-06 DIAGNOSIS — H5203 Hypermetropia, bilateral: Secondary | ICD-10-CM | POA: Diagnosis not present

## 2016-04-01 DIAGNOSIS — M810 Age-related osteoporosis without current pathological fracture: Secondary | ICD-10-CM | POA: Diagnosis not present

## 2016-10-14 DIAGNOSIS — Z23 Encounter for immunization: Secondary | ICD-10-CM | POA: Diagnosis not present

## 2016-10-14 DIAGNOSIS — M810 Age-related osteoporosis without current pathological fracture: Secondary | ICD-10-CM | POA: Diagnosis not present

## 2016-12-11 DIAGNOSIS — R7301 Impaired fasting glucose: Secondary | ICD-10-CM | POA: Diagnosis not present

## 2016-12-11 DIAGNOSIS — J449 Chronic obstructive pulmonary disease, unspecified: Secondary | ICD-10-CM | POA: Diagnosis not present

## 2016-12-11 DIAGNOSIS — E785 Hyperlipidemia, unspecified: Secondary | ICD-10-CM | POA: Diagnosis not present

## 2016-12-11 DIAGNOSIS — Z0000 Encounter for general adult medical examination without abnormal findings: Secondary | ICD-10-CM | POA: Diagnosis not present

## 2016-12-11 DIAGNOSIS — M8000XD Age-related osteoporosis with current pathological fracture, unspecified site, subsequent encounter for fracture with routine healing: Secondary | ICD-10-CM | POA: Diagnosis not present

## 2016-12-11 DIAGNOSIS — Z86010 Personal history of colonic polyps: Secondary | ICD-10-CM | POA: Diagnosis not present

## 2016-12-11 DIAGNOSIS — E559 Vitamin D deficiency, unspecified: Secondary | ICD-10-CM | POA: Diagnosis not present

## 2016-12-11 DIAGNOSIS — Z79899 Other long term (current) drug therapy: Secondary | ICD-10-CM | POA: Diagnosis not present

## 2016-12-13 ENCOUNTER — Other Ambulatory Visit: Payer: Self-pay | Admitting: Family Medicine

## 2016-12-13 DIAGNOSIS — M818 Other osteoporosis without current pathological fracture: Secondary | ICD-10-CM

## 2016-12-13 DIAGNOSIS — M810 Age-related osteoporosis without current pathological fracture: Secondary | ICD-10-CM

## 2016-12-13 DIAGNOSIS — Z1231 Encounter for screening mammogram for malignant neoplasm of breast: Secondary | ICD-10-CM

## 2017-01-23 ENCOUNTER — Ambulatory Visit
Admission: RE | Admit: 2017-01-23 | Discharge: 2017-01-23 | Disposition: A | Discharge status: Home / Self Care | Payer: Medicare Other | Source: Ambulatory Visit | Attending: Family Medicine | Admitting: Family Medicine

## 2017-01-23 DIAGNOSIS — Z1231 Encounter for screening mammogram for malignant neoplasm of breast: Secondary | ICD-10-CM | POA: Diagnosis not present

## 2017-01-23 DIAGNOSIS — M810 Age-related osteoporosis without current pathological fracture: Secondary | ICD-10-CM | POA: Diagnosis not present

## 2017-01-23 DIAGNOSIS — M818 Other osteoporosis without current pathological fracture: Principal | ICD-10-CM

## 2017-03-19 DIAGNOSIS — H40013 Open angle with borderline findings, low risk, bilateral: Secondary | ICD-10-CM | POA: Diagnosis not present

## 2017-03-19 DIAGNOSIS — H18413 Arcus senilis, bilateral: Secondary | ICD-10-CM | POA: Diagnosis not present

## 2017-03-19 DIAGNOSIS — H34232 Retinal artery branch occlusion, left eye: Secondary | ICD-10-CM | POA: Diagnosis not present

## 2017-03-19 DIAGNOSIS — H11423 Conjunctival edema, bilateral: Secondary | ICD-10-CM | POA: Diagnosis not present

## 2017-03-25 ENCOUNTER — Encounter (INDEPENDENT_AMBULATORY_CARE_PROVIDER_SITE_OTHER): Payer: Medicare Other | Admitting: Ophthalmology

## 2017-03-25 DIAGNOSIS — D3132 Benign neoplasm of left choroid: Secondary | ICD-10-CM

## 2017-03-25 DIAGNOSIS — H43813 Vitreous degeneration, bilateral: Secondary | ICD-10-CM

## 2017-03-25 DIAGNOSIS — H348321 Tributary (branch) retinal vein occlusion, left eye, with retinal neovascularization: Secondary | ICD-10-CM | POA: Diagnosis not present

## 2017-03-25 DIAGNOSIS — H35033 Hypertensive retinopathy, bilateral: Secondary | ICD-10-CM

## 2017-03-25 DIAGNOSIS — I10 Essential (primary) hypertension: Secondary | ICD-10-CM

## 2017-03-25 DIAGNOSIS — H35371 Puckering of macula, right eye: Secondary | ICD-10-CM | POA: Diagnosis not present

## 2017-04-15 DIAGNOSIS — M810 Age-related osteoporosis without current pathological fracture: Secondary | ICD-10-CM | POA: Diagnosis not present

## 2017-04-23 ENCOUNTER — Other Ambulatory Visit (INDEPENDENT_AMBULATORY_CARE_PROVIDER_SITE_OTHER): Payer: Medicare Other | Admitting: Ophthalmology

## 2017-04-23 DIAGNOSIS — H348321 Tributary (branch) retinal vein occlusion, left eye, with retinal neovascularization: Secondary | ICD-10-CM | POA: Diagnosis not present

## 2017-06-25 DIAGNOSIS — H348112 Central retinal vein occlusion, right eye, stable: Secondary | ICD-10-CM | POA: Diagnosis not present

## 2017-06-25 DIAGNOSIS — H5203 Hypermetropia, bilateral: Secondary | ICD-10-CM | POA: Diagnosis not present

## 2017-06-25 DIAGNOSIS — Z961 Presence of intraocular lens: Secondary | ICD-10-CM | POA: Diagnosis not present

## 2017-06-25 DIAGNOSIS — H52223 Regular astigmatism, bilateral: Secondary | ICD-10-CM | POA: Diagnosis not present

## 2017-09-03 ENCOUNTER — Ambulatory Visit (INDEPENDENT_AMBULATORY_CARE_PROVIDER_SITE_OTHER): Payer: Medicare Other | Admitting: Ophthalmology

## 2017-09-03 DIAGNOSIS — H348322 Tributary (branch) retinal vein occlusion, left eye, stable: Secondary | ICD-10-CM | POA: Diagnosis not present

## 2017-09-03 DIAGNOSIS — H43813 Vitreous degeneration, bilateral: Secondary | ICD-10-CM

## 2017-09-03 DIAGNOSIS — H35033 Hypertensive retinopathy, bilateral: Secondary | ICD-10-CM | POA: Diagnosis not present

## 2017-09-03 DIAGNOSIS — H35371 Puckering of macula, right eye: Secondary | ICD-10-CM | POA: Diagnosis not present

## 2017-09-03 DIAGNOSIS — D3132 Benign neoplasm of left choroid: Secondary | ICD-10-CM

## 2017-09-03 DIAGNOSIS — I10 Essential (primary) hypertension: Secondary | ICD-10-CM

## 2017-10-16 DIAGNOSIS — Z23 Encounter for immunization: Secondary | ICD-10-CM | POA: Diagnosis not present

## 2017-10-16 DIAGNOSIS — M810 Age-related osteoporosis without current pathological fracture: Secondary | ICD-10-CM | POA: Diagnosis not present

## 2017-12-24 DIAGNOSIS — H35373 Puckering of macula, bilateral: Secondary | ICD-10-CM | POA: Diagnosis not present

## 2017-12-24 DIAGNOSIS — H3589 Other specified retinal disorders: Secondary | ICD-10-CM | POA: Diagnosis not present

## 2017-12-24 DIAGNOSIS — H40023 Open angle with borderline findings, high risk, bilateral: Secondary | ICD-10-CM | POA: Diagnosis not present

## 2017-12-24 DIAGNOSIS — H348322 Tributary (branch) retinal vein occlusion, left eye, stable: Secondary | ICD-10-CM | POA: Diagnosis not present

## 2017-12-31 DIAGNOSIS — M810 Age-related osteoporosis without current pathological fracture: Secondary | ICD-10-CM | POA: Diagnosis not present

## 2017-12-31 DIAGNOSIS — R7301 Impaired fasting glucose: Secondary | ICD-10-CM | POA: Diagnosis not present

## 2017-12-31 DIAGNOSIS — Z0000 Encounter for general adult medical examination without abnormal findings: Secondary | ICD-10-CM | POA: Diagnosis not present

## 2017-12-31 DIAGNOSIS — J449 Chronic obstructive pulmonary disease, unspecified: Secondary | ICD-10-CM | POA: Diagnosis not present

## 2018-01-01 DIAGNOSIS — S32001D Stable burst fracture of unspecified lumbar vertebra, subsequent encounter for fracture with routine healing: Secondary | ICD-10-CM | POA: Diagnosis not present

## 2018-01-01 DIAGNOSIS — M5440 Lumbago with sciatica, unspecified side: Secondary | ICD-10-CM | POA: Diagnosis not present

## 2018-01-02 ENCOUNTER — Other Ambulatory Visit: Payer: Self-pay | Admitting: Neurosurgery

## 2018-01-02 DIAGNOSIS — S32001D Stable burst fracture of unspecified lumbar vertebra, subsequent encounter for fracture with routine healing: Secondary | ICD-10-CM

## 2018-01-14 ENCOUNTER — Ambulatory Visit
Admission: RE | Admit: 2018-01-14 | Discharge: 2018-01-14 | Disposition: A | Discharge status: Home / Self Care | Payer: Medicare Other | Source: Ambulatory Visit | Attending: Neurosurgery | Admitting: Neurosurgery

## 2018-01-14 DIAGNOSIS — Z981 Arthrodesis status: Secondary | ICD-10-CM | POA: Diagnosis not present

## 2018-01-14 DIAGNOSIS — S32001D Stable burst fracture of unspecified lumbar vertebra, subsequent encounter for fracture with routine healing: Secondary | ICD-10-CM

## 2018-01-15 DIAGNOSIS — S32010D Wedge compression fracture of first lumbar vertebra, subsequent encounter for fracture with routine healing: Secondary | ICD-10-CM | POA: Diagnosis not present

## 2018-01-15 DIAGNOSIS — M5440 Lumbago with sciatica, unspecified side: Secondary | ICD-10-CM | POA: Diagnosis not present

## 2018-01-15 DIAGNOSIS — Z681 Body mass index (BMI) 19 or less, adult: Secondary | ICD-10-CM | POA: Diagnosis not present

## 2018-01-16 DIAGNOSIS — H26492 Other secondary cataract, left eye: Secondary | ICD-10-CM | POA: Diagnosis not present

## 2018-01-16 DIAGNOSIS — D3132 Benign neoplasm of left choroid: Secondary | ICD-10-CM | POA: Diagnosis not present

## 2018-01-16 DIAGNOSIS — H40011 Open angle with borderline findings, low risk, right eye: Secondary | ICD-10-CM | POA: Diagnosis not present

## 2018-01-16 DIAGNOSIS — H401121 Primary open-angle glaucoma, left eye, mild stage: Secondary | ICD-10-CM | POA: Diagnosis not present

## 2018-03-06 DIAGNOSIS — H26492 Other secondary cataract, left eye: Secondary | ICD-10-CM | POA: Diagnosis not present

## 2018-03-06 DIAGNOSIS — H401121 Primary open-angle glaucoma, left eye, mild stage: Secondary | ICD-10-CM | POA: Diagnosis not present

## 2018-03-06 DIAGNOSIS — H40011 Open angle with borderline findings, low risk, right eye: Secondary | ICD-10-CM | POA: Diagnosis not present

## 2018-03-06 DIAGNOSIS — D3132 Benign neoplasm of left choroid: Secondary | ICD-10-CM | POA: Diagnosis not present

## 2018-03-17 DIAGNOSIS — M5440 Lumbago with sciatica, unspecified side: Secondary | ICD-10-CM | POA: Diagnosis not present

## 2018-04-20 DIAGNOSIS — M810 Age-related osteoporosis without current pathological fracture: Secondary | ICD-10-CM | POA: Diagnosis not present

## 2018-08-19 DIAGNOSIS — Z23 Encounter for immunization: Secondary | ICD-10-CM | POA: Diagnosis not present

## 2018-09-07 ENCOUNTER — Encounter (INDEPENDENT_AMBULATORY_CARE_PROVIDER_SITE_OTHER): Payer: Medicare Other | Admitting: Ophthalmology

## 2018-09-07 DIAGNOSIS — H35033 Hypertensive retinopathy, bilateral: Secondary | ICD-10-CM | POA: Diagnosis not present

## 2018-09-07 DIAGNOSIS — H43813 Vitreous degeneration, bilateral: Secondary | ICD-10-CM

## 2018-09-07 DIAGNOSIS — D3132 Benign neoplasm of left choroid: Secondary | ICD-10-CM | POA: Diagnosis not present

## 2018-09-07 DIAGNOSIS — I10 Essential (primary) hypertension: Secondary | ICD-10-CM | POA: Diagnosis not present

## 2018-09-07 DIAGNOSIS — H348322 Tributary (branch) retinal vein occlusion, left eye, stable: Secondary | ICD-10-CM | POA: Diagnosis not present

## 2018-10-21 DIAGNOSIS — M810 Age-related osteoporosis without current pathological fracture: Secondary | ICD-10-CM | POA: Diagnosis not present

## 2018-11-06 DIAGNOSIS — D3132 Benign neoplasm of left choroid: Secondary | ICD-10-CM | POA: Diagnosis not present

## 2018-11-06 DIAGNOSIS — H35371 Puckering of macula, right eye: Secondary | ICD-10-CM | POA: Diagnosis not present

## 2018-11-06 DIAGNOSIS — H40021 Open angle with borderline findings, high risk, right eye: Secondary | ICD-10-CM | POA: Diagnosis not present

## 2018-11-06 DIAGNOSIS — H401121 Primary open-angle glaucoma, left eye, mild stage: Secondary | ICD-10-CM | POA: Diagnosis not present

## 2019-01-25 DIAGNOSIS — D3131 Benign neoplasm of right choroid: Secondary | ICD-10-CM | POA: Diagnosis not present

## 2019-01-25 DIAGNOSIS — H40021 Open angle with borderline findings, high risk, right eye: Secondary | ICD-10-CM | POA: Diagnosis not present

## 2019-01-25 DIAGNOSIS — H0102A Squamous blepharitis right eye, upper and lower eyelids: Secondary | ICD-10-CM | POA: Diagnosis not present

## 2019-01-25 DIAGNOSIS — H348322 Tributary (branch) retinal vein occlusion, left eye, stable: Secondary | ICD-10-CM | POA: Diagnosis not present

## 2019-04-28 DIAGNOSIS — M810 Age-related osteoporosis without current pathological fracture: Secondary | ICD-10-CM | POA: Diagnosis not present

## 2019-05-05 DIAGNOSIS — H401121 Primary open-angle glaucoma, left eye, mild stage: Secondary | ICD-10-CM | POA: Diagnosis not present

## 2019-05-05 DIAGNOSIS — H40021 Open angle with borderline findings, high risk, right eye: Secondary | ICD-10-CM | POA: Diagnosis not present

## 2019-05-05 DIAGNOSIS — H35033 Hypertensive retinopathy, bilateral: Secondary | ICD-10-CM | POA: Diagnosis not present

## 2019-05-05 DIAGNOSIS — D3131 Benign neoplasm of right choroid: Secondary | ICD-10-CM | POA: Diagnosis not present

## 2019-07-14 DIAGNOSIS — Z0000 Encounter for general adult medical examination without abnormal findings: Secondary | ICD-10-CM | POA: Diagnosis not present

## 2019-07-14 DIAGNOSIS — J449 Chronic obstructive pulmonary disease, unspecified: Secondary | ICD-10-CM | POA: Diagnosis not present

## 2019-07-14 DIAGNOSIS — E785 Hyperlipidemia, unspecified: Secondary | ICD-10-CM | POA: Diagnosis not present

## 2019-07-14 DIAGNOSIS — R7301 Impaired fasting glucose: Secondary | ICD-10-CM | POA: Diagnosis not present

## 2019-07-16 ENCOUNTER — Other Ambulatory Visit: Payer: Self-pay | Admitting: Family Medicine

## 2019-07-16 DIAGNOSIS — M810 Age-related osteoporosis without current pathological fracture: Secondary | ICD-10-CM

## 2019-08-03 DIAGNOSIS — H401121 Primary open-angle glaucoma, left eye, mild stage: Secondary | ICD-10-CM | POA: Diagnosis not present

## 2019-08-03 DIAGNOSIS — D3131 Benign neoplasm of right choroid: Secondary | ICD-10-CM | POA: Diagnosis not present

## 2019-08-03 DIAGNOSIS — H35033 Hypertensive retinopathy, bilateral: Secondary | ICD-10-CM | POA: Diagnosis not present

## 2019-08-03 DIAGNOSIS — H40021 Open angle with borderline findings, high risk, right eye: Secondary | ICD-10-CM | POA: Diagnosis not present

## 2019-09-09 ENCOUNTER — Encounter (INDEPENDENT_AMBULATORY_CARE_PROVIDER_SITE_OTHER): Payer: Medicare Other | Admitting: Ophthalmology

## 2019-09-30 ENCOUNTER — Ambulatory Visit
Admission: RE | Admit: 2019-09-30 | Discharge: 2019-09-30 | Disposition: A | Discharge status: Home / Self Care | Payer: Medicare Other | Source: Ambulatory Visit

## 2019-09-30 ENCOUNTER — Ambulatory Visit
Admission: RE | Admit: 2019-09-30 | Discharge: 2019-09-30 | Disposition: A | Discharge status: Home / Self Care | Payer: Medicare Other | Source: Ambulatory Visit | Attending: Family Medicine | Admitting: Family Medicine

## 2019-09-30 ENCOUNTER — Other Ambulatory Visit: Payer: Self-pay | Admitting: Family Medicine

## 2019-09-30 ENCOUNTER — Other Ambulatory Visit: Payer: Self-pay

## 2019-09-30 DIAGNOSIS — Z1231 Encounter for screening mammogram for malignant neoplasm of breast: Secondary | ICD-10-CM

## 2019-09-30 DIAGNOSIS — M810 Age-related osteoporosis without current pathological fracture: Secondary | ICD-10-CM

## 2019-10-01 ENCOUNTER — Ambulatory Visit
Admission: RE | Admit: 2019-10-01 | Discharge: 2019-10-01 | Disposition: A | Discharge status: Home / Self Care | Payer: Medicare Other | Source: Ambulatory Visit | Attending: Family Medicine | Admitting: Family Medicine

## 2019-10-01 DIAGNOSIS — Z780 Asymptomatic menopausal state: Secondary | ICD-10-CM | POA: Diagnosis not present

## 2019-10-01 DIAGNOSIS — M810 Age-related osteoporosis without current pathological fracture: Secondary | ICD-10-CM | POA: Diagnosis not present

## 2019-10-04 DIAGNOSIS — H401121 Primary open-angle glaucoma, left eye, mild stage: Secondary | ICD-10-CM | POA: Diagnosis not present

## 2019-10-04 DIAGNOSIS — H35033 Hypertensive retinopathy, bilateral: Secondary | ICD-10-CM | POA: Diagnosis not present

## 2019-10-04 DIAGNOSIS — D3131 Benign neoplasm of right choroid: Secondary | ICD-10-CM | POA: Diagnosis not present

## 2019-10-04 DIAGNOSIS — H40021 Open angle with borderline findings, high risk, right eye: Secondary | ICD-10-CM | POA: Diagnosis not present

## 2019-10-29 DIAGNOSIS — M810 Age-related osteoporosis without current pathological fracture: Secondary | ICD-10-CM | POA: Diagnosis not present

## 2019-12-29 ENCOUNTER — Other Ambulatory Visit: Payer: Medicare Other

## 2020-01-03 DIAGNOSIS — H40021 Open angle with borderline findings, high risk, right eye: Secondary | ICD-10-CM | POA: Diagnosis not present

## 2020-01-03 DIAGNOSIS — H401121 Primary open-angle glaucoma, left eye, mild stage: Secondary | ICD-10-CM | POA: Diagnosis not present

## 2020-01-03 DIAGNOSIS — H35033 Hypertensive retinopathy, bilateral: Secondary | ICD-10-CM | POA: Diagnosis not present

## 2020-01-03 DIAGNOSIS — H35361 Drusen (degenerative) of macula, right eye: Secondary | ICD-10-CM | POA: Diagnosis not present

## 2020-01-12 DIAGNOSIS — Z0120 Encounter for dental examination and cleaning without abnormal findings: Secondary | ICD-10-CM | POA: Diagnosis not present

## 2020-02-08 DIAGNOSIS — H401121 Primary open-angle glaucoma, left eye, mild stage: Secondary | ICD-10-CM | POA: Diagnosis not present

## 2020-02-23 DIAGNOSIS — H40021 Open angle with borderline findings, high risk, right eye: Secondary | ICD-10-CM | POA: Diagnosis not present

## 2020-04-05 DIAGNOSIS — H401121 Primary open-angle glaucoma, left eye, mild stage: Secondary | ICD-10-CM | POA: Diagnosis not present

## 2020-04-05 DIAGNOSIS — Z9889 Other specified postprocedural states: Secondary | ICD-10-CM | POA: Diagnosis not present

## 2020-04-05 DIAGNOSIS — H40021 Open angle with borderline findings, high risk, right eye: Secondary | ICD-10-CM | POA: Diagnosis not present

## 2020-04-05 DIAGNOSIS — Z961 Presence of intraocular lens: Secondary | ICD-10-CM | POA: Diagnosis not present

## 2020-05-01 DIAGNOSIS — M810 Age-related osteoporosis without current pathological fracture: Secondary | ICD-10-CM | POA: Diagnosis not present

## 2020-05-16 DIAGNOSIS — Z0120 Encounter for dental examination and cleaning without abnormal findings: Secondary | ICD-10-CM | POA: Diagnosis not present

## 2020-07-14 DIAGNOSIS — H401121 Primary open-angle glaucoma, left eye, mild stage: Secondary | ICD-10-CM | POA: Diagnosis not present

## 2020-07-14 DIAGNOSIS — Z9889 Other specified postprocedural states: Secondary | ICD-10-CM | POA: Diagnosis not present

## 2020-07-14 DIAGNOSIS — H40021 Open angle with borderline findings, high risk, right eye: Secondary | ICD-10-CM | POA: Diagnosis not present

## 2020-07-19 DIAGNOSIS — R7301 Impaired fasting glucose: Secondary | ICD-10-CM | POA: Diagnosis not present

## 2020-07-19 DIAGNOSIS — J449 Chronic obstructive pulmonary disease, unspecified: Secondary | ICD-10-CM | POA: Diagnosis not present

## 2020-07-19 DIAGNOSIS — Z0000 Encounter for general adult medical examination without abnormal findings: Secondary | ICD-10-CM | POA: Diagnosis not present

## 2020-07-19 DIAGNOSIS — Z87891 Personal history of nicotine dependence: Secondary | ICD-10-CM | POA: Diagnosis not present

## 2020-08-07 DIAGNOSIS — K136 Irritative hyperplasia of oral mucosa: Secondary | ICD-10-CM | POA: Diagnosis not present

## 2020-09-13 ENCOUNTER — Telehealth: Payer: Self-pay | Admitting: Acute Care

## 2020-09-14 NOTE — Telephone Encounter (Signed)
Spoke with pt regarding lung cancer screening. PT would like to wait until after the first of the year. Will close this message and refer to referral notes.

## 2020-11-01 DIAGNOSIS — M810 Age-related osteoporosis without current pathological fracture: Secondary | ICD-10-CM | POA: Diagnosis not present

## 2020-11-17 ENCOUNTER — Other Ambulatory Visit: Payer: Self-pay | Admitting: Family Medicine

## 2020-11-17 DIAGNOSIS — Z1231 Encounter for screening mammogram for malignant neoplasm of breast: Secondary | ICD-10-CM

## 2020-11-20 DIAGNOSIS — Z9889 Other specified postprocedural states: Secondary | ICD-10-CM | POA: Diagnosis not present

## 2020-11-20 DIAGNOSIS — H401121 Primary open-angle glaucoma, left eye, mild stage: Secondary | ICD-10-CM | POA: Diagnosis not present

## 2020-11-20 DIAGNOSIS — H26493 Other secondary cataract, bilateral: Secondary | ICD-10-CM | POA: Diagnosis not present

## 2020-11-20 DIAGNOSIS — H40021 Open angle with borderline findings, high risk, right eye: Secondary | ICD-10-CM | POA: Diagnosis not present

## 2020-12-27 ENCOUNTER — Other Ambulatory Visit: Payer: Self-pay

## 2020-12-27 ENCOUNTER — Ambulatory Visit
Admission: RE | Admit: 2020-12-27 | Discharge: 2020-12-27 | Disposition: A | Discharge status: Home / Self Care | Payer: Medicare Other | Source: Ambulatory Visit | Attending: Family Medicine | Admitting: Family Medicine

## 2020-12-27 DIAGNOSIS — Z1231 Encounter for screening mammogram for malignant neoplasm of breast: Secondary | ICD-10-CM

## 2021-01-08 ENCOUNTER — Other Ambulatory Visit: Payer: Self-pay | Admitting: *Deleted

## 2021-01-08 DIAGNOSIS — Z87891 Personal history of nicotine dependence: Secondary | ICD-10-CM

## 2021-01-08 DIAGNOSIS — F17210 Nicotine dependence, cigarettes, uncomplicated: Secondary | ICD-10-CM

## 2021-02-14 ENCOUNTER — Other Ambulatory Visit: Payer: Self-pay

## 2021-02-14 ENCOUNTER — Ambulatory Visit (INDEPENDENT_AMBULATORY_CARE_PROVIDER_SITE_OTHER): Payer: Medicare Other | Admitting: Acute Care

## 2021-02-14 ENCOUNTER — Encounter: Payer: Self-pay | Admitting: Acute Care

## 2021-02-14 DIAGNOSIS — Z87891 Personal history of nicotine dependence: Secondary | ICD-10-CM | POA: Diagnosis not present

## 2021-02-14 DIAGNOSIS — F17210 Nicotine dependence, cigarettes, uncomplicated: Secondary | ICD-10-CM

## 2021-02-14 DIAGNOSIS — Z122 Encounter for screening for malignant neoplasm of respiratory organs: Secondary | ICD-10-CM

## 2021-02-14 NOTE — Patient Instructions (Signed)
Thank you for participating in the [facility] Screening Program. It was our pleasure to meet you today. We will call you with the results of your scan within the next few days. Your scan will be assigned a Lung RADS category score by the physicians reading the scans.  This Lung RADS score determines follow up scanning.  See below for description of categories, and follow up screening recommendations. We will be in touch to schedule your follow up screening annually or based on recommendations of our providers. We will fax a copy of your scan results to your Primary Care Physician, or the physician who referred you to the program, to ensure they have the results. Please call the office if you have any questions or concerns regarding your scanning experience or results.  Our office number is [phone]. Please speak with Denise Phelps, RN. She is our Lung Cancer Screening RN. If she is unavailable when you call, please have the office staff send her a message. She will return your call at her earliest convenience. Remember, if your scan is normal, we will scan you annually as long as you continue to meet the criteria for the program. (Age 55-77, Current smoker or smoker who has quit within the last 15 years). If you are a smoker, remember, quitting is the single most powerful action that you can take to decrease your risk of lung cancer and other pulmonary, breathing related problems. We know quitting is hard, and we are here to help.  Please let us know if there is anything we can do to help you meet your goal of quitting. If you are a former smoker, congratulations. We are proud of you! Remain smoke free! Remember you can refer friends or family members through the number above.  We will screen them to make sure they meet criteria for the program. Thank you for helping us take better care of you by participating in Lung Screening.  Lung RADS Categories:  Lung RADS 1: no nodules  or definitely non-concerning nodules.  Recommendation is for a repeat annual scan in 12 months.  Lung RADS 2:  nodules that are non-concerning in appearance and behavior with a very low likelihood of becoming an active cancer. Recommendation is for a repeat annual scan in 12 months.  Lung RADS 3: nodules that are probably non-concerning , includes nodules with a low likelihood of becoming an active cancer.  Recommendation is for a 6-month repeat screening scan. Often noted after an upper respiratory illness. We will be in touch to make sure you have no questions, and to schedule your 6-month scan.  Lung RADS 4 A: nodules with concerning findings, recommendation is most often for a follow up scan in 3 months or additional testing based on our provider's assessment of the scan. We will be in touch to make sure you have no questions and to schedule the recommended 3 month follow up scan.  Lung RADS 4 B:  indicates findings that are concerning. We will be in touch with you to schedule additional diagnostic testing based on our provider's  assessment of the scan.   

## 2021-02-14 NOTE — Progress Notes (Signed)
Virtual Visit via Telephone Note  I connected with Lilla Shook Ackerman on 02/14/21 at 10:00 AM EDT by telephone and verified that I am speaking with the correct person using two identifiers.  Location: Patient: At home Provider: [place], [place], [place], [address]   I discussed the limitations, risks, security and privacy concerns of performing an evaluation and management service by telephone and the availability of in person appointments. I also discussed with the patient that there may be a patient responsible charge related to this service. The patient expressed understanding and agreed to proceed.  Shared Decision Making Visit Lung Cancer Screening Program [phone])   Eligibility:  Age 75 y.o.  Pack Years Smoking History Calculation 42 pack year smoking history (# packs/per year x # years smoked)  Recent History of coughing up blood  no  Unexplained weight loss? no ( >Than 15 pounds within the last 6 months )  Prior History Lung / other cancer no (Diagnosis within the last 5 years already requiring surveillance chest CT Scans).  Smoking Status Current Smoker  Former Smokers: Years since quit: NA   Quit Date: NA  Visit Components:  Discussion included one or more decision making aids. yes  Discussion included risk/benefits of screening. yes  Discussion included potential follow up diagnostic testing for abnormal scans. yes  Discussion included meaning and risk of over diagnosis. yes  Discussion included meaning and risk of False Positives. yes  Discussion included meaning of total radiation exposure. yes  Counseling Included:  Importance of adherence to annual lung cancer LDCT screening. yes  Impact of comorbidities on ability to participate in the program. yes  Ability and willingness to under diagnostic treatment. yes  Smoking Cessation Counseling:  Current Smokers:   Discussed importance of smoking cessation. yes  Information about  tobacco cessation classes and interventions provided to patient. yes  Patient provided with "ticket" for LDCT Scan. yes  Symptomatic Patient. no  Counseling NA  Diagnosis Code: Tobacco Use Z72.0  Asymptomatic Patient yes  Counseling (Intermediate counseling: > three minutes counseling) X4128  Former Smokers:   Discussed the importance of maintaining cigarette abstinence. yes  Diagnosis Code: Personal History of Nicotine Dependence. N86.767  Information about tobacco cessation classes and interventions provided to patient. Yes  Patient provided with "ticket" for LDCT Scan. yes  Written Order for Lung Cancer Screening with LDCT placed in Epic. Yes (CT Chest Lung Cancer Screening Low Dose W/O CM) [id number] Z12.2-Screening of respiratory organs Z87.891-Personal history of nicotine dependence  I have spent 25 minutes of face to face time with Ms. Hoeschen discussing the risks and benefits of lung cancer screening. We viewed a power point together that explained in detail the above noted topics. We paused at intervals to allow for questions to be asked and answered to ensure understanding.We discussed that the single most powerful action that she can take to decrease her risk of developing lung cancer is to quit smoking. We discussed whether or not she is ready to commit to setting a quit date. We discussed options for tools to aid in quitting smoking including nicotine replacement therapy, non-nicotine medications, support groups, Quit Smart classes, and behavior modification. We discussed that often times setting smaller, more achievable goals, such as eliminating 1 cigarette a day for a week and then 2 cigarettes a day for a week can be helpful in slowly decreasing the number of cigarettes smoked. This allows for a sense of accomplishment as well as providing a clinical benefit. I  gave her the " Be Stronger Than Your Excuses" card with contact information for community resources, classes, free  nicotine replacement therapy, and access to mobile apps, text messaging, and on-line smoking cessation help. I have also given her my card and contact information in the event she needs to contact me. We discussed the time and location of the scan, and that either Doroteo Glassman RN or I will call with the results within 24-48 hours of receiving them. I have offered her  a copy of the power point we viewed  as a resource in the event they need reinforcement of the concepts we discussed today in the office. The patient verbalized understanding of all of  the above and had no further questions upon leaving the office. They have my contact information in the event they have any further questions.  I spent 3 minutes counseling on smoking cessation and the health risks of continued tobacco abuse.  I explained to the patient that there has been a high incidence of coronary artery disease noted on these exams. I explained that this is a non-gated exam therefore degree or severity cannot be determined. This patient is not on statin therapy. I have asked the patient to follow-up with their PCP regarding any incidental finding of coronary artery disease and management with diet or medication as their PCP  feels is clinically indicated. The patient verbalized understanding of the above and had no further questions upon completion of the visit.  CT Scan is being done at [place], NP 02/14/2021 9:50 AM

## 2021-02-16 ENCOUNTER — Telehealth: Payer: Self-pay | Admitting: Acute Care

## 2021-02-16 DIAGNOSIS — Z87891 Personal history of nicotine dependence: Secondary | ICD-10-CM

## 2021-02-16 DIAGNOSIS — F17210 Nicotine dependence, cigarettes, uncomplicated: Secondary | ICD-10-CM

## 2021-02-16 NOTE — Telephone Encounter (Signed)
This will get a 3 month follow up. Just want it on your radar for July. Thanks

## 2021-02-16 NOTE — Telephone Encounter (Signed)
Received call report from [facility] with [facility] Radiology on patient's CT lung cancer screening. SG please review the result/impression copied below:  IMPRESSION: 1. Lung-RADS 4A, suspicious. Irregular solid right upper lobe pulmonary nodule measuring 10.0 mm in volume derived mean diameter, new since 05/30/2009 chest CT. Separate subsolid spiculated basilar right upper lobe pulmonary nodule measuring 14.8 mm in volume derived mean diameter with 5.9 mm solid component , new since 05/30/2009 chest CT. Follow up low-dose chest CT without contrast in 3 months (please use the following order, "CT CHEST LCS NODULE FOLLOW-UP W/O CM") is recommended. Alternatively, PET may be considered when there is a solid component 53mm or larger. 2. Hypodense 3.6 cm right thyroid nodule, new since 05/30/2009 chest CT. Recommend thyroid US (ref: J Am Coll Radiol. 2015 Feb;12(2): 143-50). 3. Aortic Atherosclerosis (ICD10-I70.0) and Emphysema (ICD10-J43.9).  Please advise, thank you.

## 2021-02-16 NOTE — Telephone Encounter (Signed)
Agreed. Can set up appt with me to review scan in July.   Garner Nash, DO [facility] 02/16/2021 3:51 PM

## 2021-02-16 NOTE — Telephone Encounter (Signed)
This will be called through the screening program

## 2021-02-16 NOTE — Telephone Encounter (Signed)
Recall placed for July appt.  FYI

## 2021-02-16 NOTE — Telephone Encounter (Signed)
I have called the patient with the results of her low dose CT. I explained that her scan was read as a Lung RADS 4 A : suspicious findings, either short term follow up in 3 months or alternatively  PET Scan evaluation may be considered when there is a solid component of  8 mm or larger. We discussed that our options for follow up are a PET scan now, as the nodule is > 21mm, or a follow up CT Chest in 3 months to re-evaluate for stability, growth or resolution. She does not want to do a PET scan now. She would prefer to do a follow up Ct in 3 months.  Langley Gauss, please fax results to PCP and place order for 3 month follow up.  Dr. Rex Kras , we will do a follow up CT in 3 months. We will inform you of the results in July. Dr. Valeta Harms, just wanted this on your radar for possible bronch after 3 month follow up.  Thanks so much

## 2021-02-19 NOTE — Telephone Encounter (Signed)
CT results faxed to PCP. Order placed for 3 mth f/u low dose CT nodule f/u.

## 2021-02-19 NOTE — Addendum Note (Signed)
Addended by: Doroteo Glassman D on: 02/19/2021 08:31 AM   Modules accepted: Orders

## 2021-03-01 ENCOUNTER — Telehealth: Payer: Self-pay | Admitting: Acute Care

## 2021-03-01 NOTE — Telephone Encounter (Signed)
disreard

## 2021-03-01 NOTE — Telephone Encounter (Signed)
I have called Dr. Eddie Dibbles office to let them know this patient needs a thyroid US to better evaluate the 3.6 cm Right thyroid nodule. Virginia Rose is researching who in the office will manage this patient as Dr. Rex Kras has retired.

## 2021-03-12 DIAGNOSIS — E041 Nontoxic single thyroid nodule: Secondary | ICD-10-CM | POA: Diagnosis not present

## 2021-03-13 ENCOUNTER — Other Ambulatory Visit: Payer: Self-pay | Admitting: Family Medicine

## 2021-03-13 DIAGNOSIS — E041 Nontoxic single thyroid nodule: Secondary | ICD-10-CM

## 2021-03-20 DIAGNOSIS — H40021 Open angle with borderline findings, high risk, right eye: Secondary | ICD-10-CM | POA: Diagnosis not present

## 2021-03-20 DIAGNOSIS — M0689 Other specified rheumatoid arthritis, multiple sites: Secondary | ICD-10-CM | POA: Diagnosis not present

## 2021-03-20 DIAGNOSIS — H401121 Primary open-angle glaucoma, left eye, mild stage: Secondary | ICD-10-CM | POA: Diagnosis not present

## 2021-03-20 DIAGNOSIS — Z9889 Other specified postprocedural states: Secondary | ICD-10-CM | POA: Diagnosis not present

## 2021-03-30 ENCOUNTER — Ambulatory Visit
Admission: RE | Admit: 2021-03-30 | Discharge: 2021-03-30 | Disposition: A | Discharge status: Home / Self Care | Payer: Medicare Other | Source: Ambulatory Visit | Attending: Family Medicine | Admitting: Family Medicine

## 2021-03-30 DIAGNOSIS — E041 Nontoxic single thyroid nodule: Secondary | ICD-10-CM

## 2021-05-03 DIAGNOSIS — M810 Age-related osteoporosis without current pathological fracture: Secondary | ICD-10-CM | POA: Diagnosis not present

## 2021-05-25 DIAGNOSIS — R911 Solitary pulmonary nodule: Secondary | ICD-10-CM | POA: Diagnosis not present

## 2021-05-25 DIAGNOSIS — Z87891 Personal history of nicotine dependence: Secondary | ICD-10-CM | POA: Diagnosis not present

## 2021-05-25 DIAGNOSIS — F17210 Nicotine dependence, cigarettes, uncomplicated: Secondary | ICD-10-CM | POA: Diagnosis not present

## 2021-05-25 DIAGNOSIS — R918 Other nonspecific abnormal finding of lung field: Secondary | ICD-10-CM | POA: Diagnosis not present

## 2021-05-25 DIAGNOSIS — I700 Atherosclerosis of aorta: Secondary | ICD-10-CM | POA: Diagnosis not present

## 2021-05-25 DIAGNOSIS — J439 Emphysema, unspecified: Secondary | ICD-10-CM | POA: Diagnosis not present

## 2021-05-28 ENCOUNTER — Telehealth: Payer: Self-pay | Admitting: Acute Care

## 2021-05-28 NOTE — Telephone Encounter (Signed)
Noted by triage.  

## 2021-05-28 NOTE — Telephone Encounter (Signed)
Received report from [facility] Radiology on CT Chest w/o contrast. Results were faxed to office and given to Sarah in hand. Sarah please advise.

## 2021-05-30 ENCOUNTER — Telehealth: Payer: Self-pay | Admitting: Acute Care

## 2021-05-30 NOTE — Telephone Encounter (Signed)
I have attempted to call the patient with the results of her CT Chest. There was no answer at either the home or cell number. I have  left a HIPPA compliant message  on both VM's requesting the patient call the office to review the results of the scan. We will call again if we do not hear from her tomorrow.

## 2021-05-31 NOTE — Telephone Encounter (Signed)
Magdalen Spatz, NP  Rosana Berger, CMA Caller: Judeen Hammans Wilburn Mylar,  5:03 PM) August 19th./ Sorry Magda Paganini.   Spoke with the pt and scheduled for Aug 19th at 2 pm. The 10:30 nodule spot already taken. Nothing further needed.

## 2021-05-31 NOTE — Telephone Encounter (Signed)
Virginia Rose, can you please clarify what date you want her seen. It said April. Just want to be sure what time frame she needs to be seen in, thanks!

## 2021-05-31 NOTE — Telephone Encounter (Signed)
I have called Virginia Rose with the results of her CT scan.  I explained that the areas of concern that we saw in April are stable, but still concerning for bronchogenic cancer.  I have offered her the option of a PET scan with follow-up with Virginia Rose, or just follow-up with Virginia Rose to discuss whether or not she wants a PET scan.  She had previously deferred a PET scan in April.  She prefers again to defer PET scan at present time, and to meet with Virginia Rose to discuss plans moving forward. I have tentatively scheduled her for April 19 at 10:30 AM which is an open slot with Virginia Rose and one of his nodule appointments. Triage please go ahead and place Virginia Rose in this slot so that she can be seen by Virginia Rose.  We did discuss that this is a month away and she is okay with that. Thanks so much

## 2021-06-28 NOTE — Progress Notes (Signed)
Synopsis: Referred in Aug 2022 for lung nodules PCP: by Hulan Fess, MD  Subjective:   PATIENT ID: Virginia Rose GENDER: female DOB: January 02, 1946, MRN: [id number]  Chief Complaint  Patient presents with   Consult    Referred by SG for lung nodule found on a CT scan that she had 3 months ago. She is currently scheduled for a PET scan but wants to discuss if the scan is necessary. Denies any symptoms.     75 year old female, past medical history of COPD, upper lobe emphysema, longstanding history of smoking, quit in July of this past year.  Enrolled in our lung cancer screening program.  She had a lung cancer screening CT that revealed 2 right-sided nodules.  She ultimately went a follow-up CT imaging which showed persistence.  Patient now here today to discuss neck steps.  She is very concerned about having any counter treatments related to her malignancy if she was to have a lung cancer.  She saw her husband undergo a significant bout with head and neck cancer that included radiation and chemotherapy.  Ultimately he passed from this.   Past Medical History:  Diagnosis Date   Arthritis    COPD (chronic obstructive pulmonary disease) (HCC)    Hypercholesteremia    Osteoporosis    Shortness of breath    with exertion     No family history on file.   Past Surgical History:  Procedure Laterality Date   ABDOMINAL HYSTERECTOMY     APPENDECTOMY     COLONOSCOPY W/ POLYPECTOMY     EYE SURGERY     bilateral cataract removal   POSTERIOR LUMBAR FUSION 4 LEVEL N/A 08/03/2014   Procedure: THORACIC TEN-ELEVEN,THORACIC ELEVEN-TWELVE,THORACIC TWELVE-LUMBAR ONE,LUMBARTWO-THREE,LUMBAR THREE-FOUR POSTERIOR LATERAL FUSION;  Surgeon: Elaina Hoops, MD;  Location: [facility];  Service: Neurosurgery;  Laterality: N/A;  Site includes thoracic and lumbar spine    Social History   Socioeconomic History   Marital status: Widowed    Spouse name: Not on file   Number of children: Not on file    Years of education: Not on file   Highest education level: Not on file  Occupational History   Not on file  Tobacco Use   Smoking status: Every Day    Packs/day: 0.50    Years: 48.00    Pack years: 24.00    Types: Cigarettes   Smokeless tobacco: Never  Substance and Sexual Activity   Alcohol use: Yes    Comment: occassionally drinks wine on with supper & on weekends   Drug use: No   Sexual activity: Not on file  Other Topics Concern   Not on file  Social History Narrative   Not on file   Social Determinants of Health   Financial Resource Strain: Not on file  Food Insecurity: Not on file  Transportation Needs: Not on file  Physical Activity: Not on file  Stress: Not on file  Social Connections: Not on file  Intimate Partner Violence: Not on file     No Known Allergies   Outpatient Medications Prior to Visit  Medication Sig Dispense Refill   alendronate (FOSAMAX) 70 MG tablet Take 70 mg by mouth every 7 (seven) days. Take with a full glass of water on an empty stomach. Take on Mondays     brimonidine (ALPHAGAN) 0.2 % ophthalmic solution Place 1 drop into both eyes 2 (two) times daily.     cyclobenzaprine (FLEXERIL) 10 MG tablet Take 1 tablet (10 mg total)  by mouth 3 (three) times daily as needed for muscle spasms. 80 tablet 1   cyclobenzaprine (FLEXERIL) 10 MG tablet Take 1 tablet (10 mg total) by mouth 3 (three) times daily as needed for muscle spasms. 80 tablet 0   denosumab (PROLIA) 60 MG/ML SOSY injection 60 mg     dorzolamide (TRUSOPT) 2 % ophthalmic solution INSTILL 1 DROP INTO OU BID     simvastatin (ZOCOR) 40 MG tablet Take 40 mg by mouth every evening.     HYDROcodone-acetaminophen (NORCO/VICODIN) 5-325 MG per tablet Take 1 tablet by mouth every 6 (six) hours as needed for moderate pain.     hydroxypropyl methylcellulose (ISOPTO TEARS) 2.5 % ophthalmic solution Place 1 drop into both eyes daily.     oxyCODONE-acetaminophen (PERCOCET/ROXICET) 5-325 MG per tablet  Take 1-2 tablets by mouth every 4 (four) hours as needed for moderate pain. 80 tablet 0   No facility-administered medications prior to visit.    Review of Systems  Constitutional:  Negative for chills, fever, malaise/fatigue and weight loss.  HENT:  Negative for hearing loss, sore throat and tinnitus.   Eyes:  Negative for blurred vision and double vision.  Respiratory:  Negative for cough, hemoptysis, sputum production, shortness of breath, wheezing and stridor.   Cardiovascular:  Negative for chest pain, palpitations, orthopnea, leg swelling and PND.  Gastrointestinal:  Negative for abdominal pain, constipation, diarrhea, heartburn, nausea and vomiting.  Genitourinary:  Negative for dysuria, hematuria and urgency.  Musculoskeletal:  Negative for joint pain and myalgias.  Skin:  Negative for itching and rash.  Neurological:  Negative for dizziness, tingling, weakness and headaches.  Endo/Heme/Allergies:  Negative for environmental allergies. Does not bruise/bleed easily.  Psychiatric/Behavioral:  Negative for depression. The patient is not nervous/anxious and does not have insomnia.   All other systems reviewed and are negative.   Objective:  Physical Exam Vitals reviewed.  Constitutional:      General: She is not in acute distress.    Appearance: She is well-developed.  HENT:     Head: Normocephalic and atraumatic.  Eyes:     General: No scleral icterus.    Conjunctiva/sclera: Conjunctivae normal.     Pupils: Pupils are equal, round, and reactive to light.  Neck:     Vascular: No JVD.     Trachea: No tracheal deviation.  Cardiovascular:     Rate and Rhythm: Normal rate and regular rhythm.     Heart sounds: Normal heart sounds. No murmur heard. Pulmonary:     Effort: Pulmonary effort is normal. No tachypnea, accessory muscle usage or respiratory distress.     Breath sounds: No stridor. No wheezing, rhonchi or rales.     Comments: Diminished breath sounds  bilaterally Abdominal:     General: Bowel sounds are normal. There is no distension.     Palpations: Abdomen is soft.     Tenderness: There is no abdominal tenderness.  Musculoskeletal:        General: No tenderness.     Cervical back: Neck supple.  Lymphadenopathy:     Cervical: No cervical adenopathy.  Skin:    General: Skin is warm and dry.     Capillary Refill: Capillary refill takes less than 2 seconds.     Findings: No rash.  Neurological:     Mental Status: She is alert and oriented to person, place, and time.  Psychiatric:        Behavior: Behavior normal.     Vitals:   06/29/21 1403  BP: 118/76  Pulse: 75  SpO2: 99%  Weight: 93 lb 12.8 oz (42.5 kg)  Height: 5' 3.75" (1.619 m)   99% on RA BMI Readings from Last 3 Encounters:  06/29/21 16.23 kg/m  08/03/14 18.48 kg/m  07/27/14 16.82 kg/m   Wt Readings from Last 3 Encounters:  06/29/21 93 lb 12.8 oz (42.5 kg)  08/03/14 106 lb 12.8 oz (48.4 kg)  07/27/14 97 lb 3.2 oz (44.1 kg)     CBC    Component Value Date/Time   WBC 7.2 08/08/2014 0947   RBC 3.51 (L) 08/08/2014 0947   HGB 10.9 (L) 08/08/2014 0947   HCT 31.5 (L) 08/08/2014 0947   PLT 191 08/08/2014 0947   MCV 89.7 08/08/2014 0947   MCH 31.1 08/08/2014 0947   MCHC 34.6 08/08/2014 0947   RDW 15.0 08/08/2014 0947   LYMPHSABS 1.6 08/08/2014 0947   MONOABS 0.6 08/08/2014 0947   EOSABS 0.2 08/08/2014 0947   BASOSABS 0.0 08/08/2014 0947     Chest Imaging:  July CT chest: This was completed at [facility].  I was able to view images in PACS system.  CT imaging reveals 2 right upper lobe pulmonary nodules a solid nodule and then a subsolid groundglass peripheral nodule.  Both concerning for potential underlying malignancy. The patient's images have been independently reviewed by me.    Pulmonary Functions Testing Results: No flowsheet data found.  FeNO:   Pathology:   Echocardiogram:   Heart Catheterization:     Assessment & Plan:      ICD-10-CM   1. Lung nodules  R91.8 Ambulatory referral to Pulmonology    Procedural/ Surgical Case Request: VIDEO BRONCHOSCOPY WITH ENDOBRONCHIAL NAVIGATION    CT Super D Chest Wo Contrast    NM PET Image Initial (PI) Skull Base To Thigh (F-18 FDG)    2. Former smoker  Z87.891     3. Centrilobular emphysema ([place])  J43.2       Discussion:  This is a 75 year old female, likely has COPD, she has upper lobe predominant emphysema on CT imaging, former smoker had an abnormal lung cancer screening CT and a follow-up CT imaging shows persistence of a right upper lobe nodule and a right upper lobe groundglass opacity concerning for metachronous primary.  Plan: Today in the office we discussed the risk benefits and alternatives of either doing nothing about her lung nodules or considering tissue biopsy. Patient is very averse to considering surgery evaluation. She is also not had any recent pulmonary function tests.  She is also not on any COPD maintenance medications at this time.  Also not interested in inhalers. We discussed what it would entail to undergo robotic assisted bronchoscopy to include tissue biopsy and fiducial placement and referral for SBRT. After discussing what this would look like and the fact that her radiation treatments would likely be very short in comparison to what her husband had to undergo with head and neck cancer she is amendable to considering this process.  We discussed the risk of bleeding and pneumothorax. Is she does rely on her son who lives now in [place] for help with her medical care and transportation. She does live alone at this time She does have a neighbor that helps on occasion  She would like to go out of town in a few weeks to visit her son and his new wife.  Therefore we will plan for bronchoscopy on 07/31/2021. In the meantime between now and bronchoscopy date we will have  a nuclear medicine pet image scheduled as well as a super D CT to  help planning software for tissue sampling of the nodules. Patient is agreeable to this plan.   Current Outpatient Medications:    alendronate (FOSAMAX) 70 MG tablet, Take 70 mg by mouth every 7 (seven) days. Take with a full glass of water on an empty stomach. Take on Mondays, Disp: , Rfl:    brimonidine (ALPHAGAN) 0.2 % ophthalmic solution, Place 1 drop into both eyes 2 (two) times daily., Disp: , Rfl:    cyclobenzaprine (FLEXERIL) 10 MG tablet, Take 1 tablet (10 mg total) by mouth 3 (three) times daily as needed for muscle spasms., Disp: 80 tablet, Rfl: 1   cyclobenzaprine (FLEXERIL) 10 MG tablet, Take 1 tablet (10 mg total) by mouth 3 (three) times daily as needed for muscle spasms., Disp: 80 tablet, Rfl: 0   denosumab (PROLIA) 60 MG/ML SOSY injection, 60 mg, Disp: , Rfl:    dorzolamide (TRUSOPT) 2 % ophthalmic solution, INSTILL 1 DROP INTO OU BID, Disp: , Rfl:    simvastatin (ZOCOR) 40 MG tablet, Take 40 mg by mouth every evening., Disp: , Rfl:   I spent 63 minutes dedicated to the care of this patient on the date of this encounter to include pre-visit review of records, face-to-face time with the patient discussing conditions above, post visit ordering of testing, clinical documentation with the electronic health record, making appropriate referrals as documented, and communicating necessary findings to members of the patients care team.   Garner Nash, DO [facility] 06/29/2021 3:20 PM

## 2021-06-29 ENCOUNTER — Ambulatory Visit: Payer: Medicare Other | Admitting: Pulmonary Disease

## 2021-06-29 ENCOUNTER — Encounter: Payer: Self-pay | Admitting: Pulmonary Disease

## 2021-06-29 ENCOUNTER — Other Ambulatory Visit: Payer: Self-pay

## 2021-06-29 ENCOUNTER — Telehealth: Payer: Self-pay | Admitting: Pulmonary Disease

## 2021-06-29 VITALS — BP 118/76 | HR 75 | Ht 63.75 in | Wt 93.8 lb

## 2021-06-29 DIAGNOSIS — R918 Other nonspecific abnormal finding of lung field: Secondary | ICD-10-CM | POA: Diagnosis not present

## 2021-06-29 DIAGNOSIS — Z87891 Personal history of nicotine dependence: Secondary | ICD-10-CM

## 2021-06-29 DIAGNOSIS — J432 Centrilobular emphysema: Secondary | ICD-10-CM

## 2021-06-29 NOTE — Patient Instructions (Signed)
Thank you for visiting Dr. Valeta Harms at [facility]. Today we recommend the following:  Orders Placed This Encounter  Procedures   Procedural/ Surgical Case Request: VIDEO BRONCHOSCOPY WITH ENDOBRONCHIAL NAVIGATION   CT Super D Chest Wo Contrast   NM PET Image Initial (PI) Skull Base To Thigh (F-18 FDG)   Ambulatory referral to Pulmonology   Bronchoscopy planned 07/31/2021  Return in about 6 weeks (around 08/10/2021) for with APP or Dr. Valeta Harms.    Please do your part to reduce the spread of COVID-19.

## 2021-06-29 NOTE — Telephone Encounter (Signed)
I scheduled pt for ENB on 9/20 at [facility].  She is going for PET scan on 9/9 and will get CT on 9/16 at [facility] CT and will also get covid test that day.  I spoke to pt & gave her appt info.

## 2021-07-20 ENCOUNTER — Other Ambulatory Visit: Payer: Self-pay

## 2021-07-20 ENCOUNTER — Encounter (HOSPITAL_COMMUNITY)
Admission: RE | Admit: 2021-07-20 | Discharge: 2021-07-20 | Disposition: A | Discharge status: Home / Self Care | Payer: Medicare Other | Source: Ambulatory Visit | Attending: Pulmonary Disease | Admitting: Pulmonary Disease

## 2021-07-20 DIAGNOSIS — Z981 Arthrodesis status: Secondary | ICD-10-CM | POA: Diagnosis not present

## 2021-07-20 DIAGNOSIS — J439 Emphysema, unspecified: Secondary | ICD-10-CM | POA: Diagnosis not present

## 2021-07-20 DIAGNOSIS — I700 Atherosclerosis of aorta: Secondary | ICD-10-CM | POA: Insufficient documentation

## 2021-07-20 DIAGNOSIS — E079 Disorder of thyroid, unspecified: Secondary | ICD-10-CM | POA: Insufficient documentation

## 2021-07-20 DIAGNOSIS — J432 Centrilobular emphysema: Secondary | ICD-10-CM | POA: Diagnosis not present

## 2021-07-20 DIAGNOSIS — R918 Other nonspecific abnormal finding of lung field: Secondary | ICD-10-CM | POA: Diagnosis not present

## 2021-07-20 LAB — GLUCOSE, CAPILLARY: Glucose-Capillary: 100 mg/dL — ABNORMAL HIGH (ref 70–99)

## 2021-07-20 MED ORDER — FLUDEOXYGLUCOSE F - 18 (FDG) INJECTION
5.0000 | Freq: Once | INTRAVENOUS | Status: AC
  Administered 2021-07-20: 5.01 via INTRAVENOUS

## 2021-07-25 ENCOUNTER — Other Ambulatory Visit: Payer: Self-pay

## 2021-07-25 ENCOUNTER — Ambulatory Visit (INDEPENDENT_AMBULATORY_CARE_PROVIDER_SITE_OTHER)
Admission: RE | Admit: 2021-07-25 | Discharge: 2021-07-25 | Disposition: A | Discharge status: Home / Self Care | Payer: Medicare Other | Source: Ambulatory Visit | Attending: Pulmonary Disease | Admitting: Pulmonary Disease

## 2021-07-25 DIAGNOSIS — R918 Other nonspecific abnormal finding of lung field: Secondary | ICD-10-CM | POA: Diagnosis not present

## 2021-07-25 DIAGNOSIS — I700 Atherosclerosis of aorta: Secondary | ICD-10-CM | POA: Diagnosis not present

## 2021-07-25 DIAGNOSIS — J439 Emphysema, unspecified: Secondary | ICD-10-CM | POA: Diagnosis not present

## 2021-07-25 DIAGNOSIS — R911 Solitary pulmonary nodule: Secondary | ICD-10-CM | POA: Diagnosis not present

## 2021-07-27 ENCOUNTER — Other Ambulatory Visit: Payer: Medicare Other

## 2021-07-27 ENCOUNTER — Encounter (HOSPITAL_COMMUNITY): Payer: Self-pay | Admitting: Pulmonary Disease

## 2021-07-27 NOTE — Progress Notes (Signed)
DUE TO COVID-19 ONLY ONE VISITOR IS ALLOWED TO COME WITH YOU AND STAY IN THE WAITING ROOM ONLY DURING PRE OP AND PROCEDURE DAY OF SURGERY.   PCP - Loura Pardon, NP Cardiologist - n/a  CT Chest x-ray - 07/25/21 EKG - 07/27/14 Stress Test - n/a ECHO - n/a Cardiac Cath - n/a  ICD Pacemaker/Loop - n/a  Sleep Study -  n/a CPAP - none  Anesthesia review: Yes  STOP now taking any Aspirin (unless otherwise instructed by your surgeon), Aleve, Naproxen, Ibuprofen, Motrin, Advil, Goody's, BC's, all herbal medications, fish oil, and all vitamins.   Coronavirus Screening Covid test is scheduled on 07/27/21 Do you have any of the following symptoms:  Cough yes/no: No Fever (>100.7F)  yes/no: No Runny nose yes/no: No Sore throat yes/no: No Difficulty breathing/shortness of breath  yes/no: No  Have you traveled in the last 14 days and where? yes/no: No  Patient verbalized understanding of instructions that were given via phone.

## 2021-07-30 NOTE — Progress Notes (Signed)
Patient tested for Covid at [facility] on 07/27/21.  Results can be found in paper chart.

## 2021-07-30 NOTE — Anesthesia Preprocedure Evaluation (Addendum)
Anesthesia Evaluation  Patient identified by MRN, date of birth, ID band Patient awake    Reviewed: Allergy & Precautions, NPO status , Patient's Chart, lab work & pertinent test results  History of Anesthesia Complications (+) history of anesthetic complications  Airway Mallampati: II  TM Distance: >3 FB Neck ROM: Full    Dental no notable dental hx. (+) Teeth Intact, Chipped, Dental Advidsory Given, Implants, Partial Lower, Dental Advisory Given,    Pulmonary shortness of breath and with exertion, COPD, Current Smoker, former smoker,    Pulmonary exam normal breath sounds clear to auscultation       Cardiovascular negative cardio ROS Normal cardiovascular exam Rhythm:Regular Rate:Normal     Neuro/Psych negative neurological ROS  negative psych ROS   GI/Hepatic negative GI ROS, Neg liver ROS,   Endo/Other  negative endocrine ROS  Renal/GU negative Renal ROS  negative genitourinary   Musculoskeletal  (+) Arthritis , Osteoarthritis,    Abdominal   Peds negative pediatric ROS (+)  Hematology negative hematology ROS (+)   Anesthesia Other Findings Chipped left front tooth  Reproductive/Obstetrics negative OB ROS                           Anesthesia Physical  Anesthesia Plan  ASA: 3  Anesthesia Plan: General ETT and General   Post-op Pain Management:    Induction: Intravenous  PONV Risk Score and Plan: 3 and Ondansetron and Dexamethasone  Airway Management Planned: Oral ETT  Additional Equipment: None  Intra-op Plan:   Post-operative Plan: Extubation in OR  Informed Consent: I have reviewed the patients History and Physical, chart, labs and discussed the procedure including the risks, benefits and alternatives for the proposed anesthesia with the patient or authorized representative who has indicated his/her understanding and acceptance.     Dental Advisory Given  Plan  Discussed with: Anesthesiologist and CRNA  Anesthesia Plan Comments:         Anesthesia Quick Evaluation

## 2021-07-31 ENCOUNTER — Encounter (HOSPITAL_COMMUNITY)
Admission: RE | Disposition: A | Discharge status: Home / Self Care | Payer: Self-pay | Source: Ambulatory Visit | Attending: Pulmonary Disease

## 2021-07-31 ENCOUNTER — Other Ambulatory Visit: Payer: Self-pay

## 2021-07-31 ENCOUNTER — Telehealth: Payer: Self-pay | Admitting: Internal Medicine

## 2021-07-31 ENCOUNTER — Ambulatory Visit (HOSPITAL_COMMUNITY)
Admission: RE | Admit: 2021-07-31 | Discharge: 2021-07-31 | Disposition: A | Discharge status: Home / Self Care | Payer: Medicare Other | Source: Ambulatory Visit | Attending: Pulmonary Disease | Admitting: Pulmonary Disease

## 2021-07-31 ENCOUNTER — Ambulatory Visit (HOSPITAL_COMMUNITY): Payer: Medicare Other | Admitting: Physician Assistant

## 2021-07-31 ENCOUNTER — Ambulatory Visit (HOSPITAL_COMMUNITY): Payer: Medicare Other

## 2021-07-31 ENCOUNTER — Telehealth: Payer: Self-pay | Admitting: Pulmonary Disease

## 2021-07-31 ENCOUNTER — Encounter (HOSPITAL_COMMUNITY): Payer: Self-pay | Admitting: Pulmonary Disease

## 2021-07-31 DIAGNOSIS — Z419 Encounter for procedure for purposes other than remedying health state, unspecified: Secondary | ICD-10-CM

## 2021-07-31 DIAGNOSIS — J432 Centrilobular emphysema: Secondary | ICD-10-CM | POA: Insufficient documentation

## 2021-07-31 DIAGNOSIS — Z87891 Personal history of nicotine dependence: Secondary | ICD-10-CM | POA: Diagnosis not present

## 2021-07-31 DIAGNOSIS — Z7983 Long term (current) use of bisphosphonates: Secondary | ICD-10-CM | POA: Insufficient documentation

## 2021-07-31 DIAGNOSIS — J95811 Postprocedural pneumothorax: Secondary | ICD-10-CM

## 2021-07-31 DIAGNOSIS — R918 Other nonspecific abnormal finding of lung field: Secondary | ICD-10-CM | POA: Diagnosis not present

## 2021-07-31 DIAGNOSIS — C3411 Malignant neoplasm of upper lobe, right bronchus or lung: Secondary | ICD-10-CM | POA: Diagnosis not present

## 2021-07-31 DIAGNOSIS — J939 Pneumothorax, unspecified: Secondary | ICD-10-CM

## 2021-07-31 DIAGNOSIS — E440 Moderate protein-calorie malnutrition: Secondary | ICD-10-CM | POA: Diagnosis not present

## 2021-07-31 DIAGNOSIS — Z9889 Other specified postprocedural states: Secondary | ICD-10-CM

## 2021-07-31 DIAGNOSIS — J449 Chronic obstructive pulmonary disease, unspecified: Secondary | ICD-10-CM | POA: Diagnosis not present

## 2021-07-31 DIAGNOSIS — I700 Atherosclerosis of aorta: Secondary | ICD-10-CM | POA: Diagnosis not present

## 2021-07-31 DIAGNOSIS — Z79899 Other long term (current) drug therapy: Secondary | ICD-10-CM | POA: Diagnosis not present

## 2021-07-31 DIAGNOSIS — C3412 Malignant neoplasm of upper lobe, left bronchus or lung: Secondary | ICD-10-CM | POA: Insufficient documentation

## 2021-07-31 DIAGNOSIS — E7800 Pure hypercholesterolemia, unspecified: Secondary | ICD-10-CM | POA: Diagnosis not present

## 2021-07-31 HISTORY — DX: Other amnesia: R41.3

## 2021-07-31 HISTORY — PX: FIDUCIAL MARKER PLACEMENT: SHX6858

## 2021-07-31 HISTORY — DX: Personal history of other medical treatment: Z92.89

## 2021-07-31 HISTORY — DX: Other complications of anesthesia, initial encounter: T88.59XA

## 2021-07-31 HISTORY — PX: VIDEO BRONCHOSCOPY WITH RADIAL ENDOBRONCHIAL ULTRASOUND: SHX6849

## 2021-07-31 HISTORY — PX: BRONCHIAL NEEDLE ASPIRATION BIOPSY: SHX5106

## 2021-07-31 HISTORY — PX: BRONCHIAL BRUSHINGS: SHX5108

## 2021-07-31 HISTORY — DX: Nicotine dependence, unspecified, uncomplicated: F17.200

## 2021-07-31 HISTORY — PX: VIDEO BRONCHOSCOPY WITH ENDOBRONCHIAL NAVIGATION: SHX6175

## 2021-07-31 HISTORY — PX: BRONCHIAL WASHINGS: SHX5105

## 2021-07-31 HISTORY — PX: BRONCHIAL BIOPSY: SHX5109

## 2021-07-31 LAB — COMPREHENSIVE METABOLIC PANEL
ALT: 12 U/L (ref 0–44)
AST: 19 U/L (ref 15–41)
Albumin: 3.9 g/dL (ref 3.5–5.0)
Alkaline Phosphatase: 54 U/L (ref 38–126)
Anion gap: 12 (ref 5–15)
BUN: 9 mg/dL (ref 8–23)
CO2: 23 mmol/L (ref 22–32)
Calcium: 9.1 mg/dL (ref 8.9–10.3)
Chloride: 98 mmol/L (ref 98–111)
Creatinine, Ser: 0.65 mg/dL (ref 0.44–1.00)
GFR, Estimated: >60 mL/min (ref >60)
Glucose, Bld: 92 mg/dL (ref 70–99)
Potassium: 3.6 mmol/L (ref 3.5–5.1)
Sodium: 133 mmol/L — ABNORMAL LOW (ref 135–145)
Total Bilirubin: 0.7 mg/dL (ref 0.3–1.2)
Total Protein: 7.2 g/dL (ref 6.5–8.1)

## 2021-07-31 LAB — CBC
HCT: 36.1 % (ref 36.0–46.0)
Hemoglobin: 12 g/dL (ref 12.0–15.0)
MCH: 31.9 pg (ref 26.0–34.0)
MCHC: 33.2 g/dL (ref 30.0–36.0)
MCV: 96 fL (ref 80.0–100.0)
Platelets: 269 10*3/uL (ref 150–400)
RBC: 3.76 MIL/uL — ABNORMAL LOW (ref 3.87–5.11)
RDW: 12.5 % (ref 11.5–15.5)
WBC: 6.5 10*3/uL (ref 4.0–10.5)
nRBC: 0 % (ref 0.0–0.2)

## 2021-07-31 SURGERY — VIDEO BRONCHOSCOPY WITH ENDOBRONCHIAL NAVIGATION
Anesthesia: General

## 2021-07-31 MED ORDER — SUGAMMADEX SODIUM 200 MG/2ML IV SOLN
INTRAVENOUS | Status: DC | PRN
  Administered 2021-07-31: 100 mg via INTRAVENOUS

## 2021-07-31 MED ORDER — ONDANSETRON HCL 4 MG/2ML IJ SOLN
INTRAMUSCULAR | Status: DC | PRN
  Administered 2021-07-31: 4 mg via INTRAVENOUS

## 2021-07-31 MED ORDER — ACETAMINOPHEN 325 MG PO TABS
325.0000 mg | ORAL_TABLET | ORAL | Status: DC | PRN
Start: 2021-07-31 — End: 2021-08-02

## 2021-07-31 MED ORDER — FENTANYL CITRATE (PF) 100 MCG/2ML IJ SOLN
25.0000 ug | INTRAMUSCULAR | Status: DC | PRN
  Administered 2021-07-31: 50 ug via INTRAVENOUS

## 2021-07-31 MED ORDER — LACTATED RINGERS IV SOLN: INTRAVENOUS | Status: DC

## 2021-07-31 MED ORDER — ROCURONIUM BROMIDE 10 MG/ML (PF) SYRINGE
PREFILLED_SYRINGE | INTRAVENOUS | Status: DC | PRN
Start: 2021-07-31 — End: 2021-07-31
  Administered 2021-07-31: 40 mg via INTRAVENOUS
  Administered 2021-07-31: 20 mg via INTRAVENOUS

## 2021-07-31 MED ORDER — MEPERIDINE HCL 100 MG/ML IJ SOLN: 6.2500 mg | INTRAMUSCULAR | Status: DC | PRN

## 2021-07-31 MED ORDER — ACETAMINOPHEN 160 MG/5ML PO SOLN: 325.0000 mg | ORAL | Status: DC | PRN

## 2021-07-31 MED ORDER — FENTANYL CITRATE (PF) 250 MCG/5ML IJ SOLN
INTRAMUSCULAR | Status: DC | PRN
  Administered 2021-07-31: 50 ug via INTRAVENOUS

## 2021-07-31 MED ORDER — PROPOFOL 10 MG/ML IV BOLUS
INTRAVENOUS | Status: DC | PRN
  Administered 2021-07-31: 20 mg via INTRAVENOUS
  Administered 2021-07-31: 100 mg via INTRAVENOUS

## 2021-07-31 MED ORDER — OXYCODONE HCL 5 MG/5ML PO SOLN: 5.0000 mg | Freq: Once | ORAL | Status: DC | PRN

## 2021-07-31 MED ORDER — DEXAMETHASONE SODIUM PHOSPHATE 10 MG/ML IJ SOLN
INTRAMUSCULAR | Status: DC | PRN
  Administered 2021-07-31: 4 mg via INTRAVENOUS

## 2021-07-31 MED ORDER — FENTANYL CITRATE (PF) 100 MCG/2ML IJ SOLN
INTRAMUSCULAR | Status: AC
  Filled 2021-07-31: qty 2

## 2021-07-31 MED ORDER — PHENYLEPHRINE HCL-NACL 20-0.9 MG/250ML-% IV SOLN
INTRAVENOUS | Status: DC | PRN
  Administered 2021-07-31: 20 ug/min via INTRAVENOUS

## 2021-07-31 MED ORDER — CHLORHEXIDINE GLUCONATE 0.12 % MT SOLN
15.0000 mL | Freq: Once | OROMUCOSAL | Status: AC
  Administered 2021-07-31: 15 mL via OROMUCOSAL
  Filled 2021-07-31 (×2): qty 15

## 2021-07-31 MED ORDER — OXYCODONE HCL 5 MG PO TABS: 5.0000 mg | ORAL_TABLET | Freq: Once | ORAL | Status: DC | PRN

## 2021-07-31 MED ORDER — ONDANSETRON HCL 4 MG/2ML IJ SOLN: 4.0000 mg | Freq: Once | INTRAMUSCULAR | Status: DC | PRN

## 2021-07-31 MED ORDER — LIDOCAINE 2% (20 MG/ML) 5 ML SYRINGE
INTRAMUSCULAR | Status: DC | PRN
  Administered 2021-07-31: 40 mg via INTRAVENOUS

## 2021-07-31 SURGICAL SUPPLY — 47 items

## 2021-07-31 NOTE — Procedures (Signed)
Thoracentesis  Procedure Note  Virginia Rose  [id number]  [birth year]  Date:07/31/21  Time:10:44 AM   Provider Performing:Everly Rubalcava C Tamala Julian   Procedure: Thoracentesis with imaging guidance (67011)  Indication(s) Pneumothorax  Consent Risks of the procedure as well as the alternatives and risks of each were explained to the patient and/or caregiver.  Consent for the procedure was obtained and is signed in the bedside chart  Anesthesia Topical only with 1% lidocaine    Time Out Verified patient identification, verified procedure, site/side was marked, verified correct patient position, special equipment/implants available, medications/allergies/relevant history reviewed, required imaging and test results available.   Sterile Technique Maximal sterile technique including full sterile barrier drape, hand hygiene, sterile gown, sterile gloves, mask, hair covering, sterile ultrasound probe cover (if used).  Procedure Description Ultrasound was used to identify appropriate pleural anatomy for placement and overlying skin marked.  Area of drainage cleaned and draped in sterile fashion. Lidocaine was used to anesthetize the skin and subcutaneous tissue.  500 cc of air was drained from the right pleural space. Catheter then removed and bandaid applied to site.   Complications/Tolerance None; patient tolerated the procedure well. Chest X-ray is ordered to confirm no post-procedural complication.   EBL Minimal   Specimen(s) None

## 2021-07-31 NOTE — Transfer of Care (Signed)
Immediate Anesthesia Transfer of Care Note  Patient: Virginia Rose  Procedure(s) Performed: VIDEO BRONCHOSCOPY WITH ENDOBRONCHIAL NAVIGATION BRONCHIAL BIOPSIES BRONCHIAL BRUSHINGS BRONCHIAL NEEDLE ASPIRATION BIOPSIES RADIAL ENDOBRONCHIAL ULTRASOUND BRONCHIAL WASHINGS  Patient Location: PACU  Anesthesia Type:General  Level of Consciousness: awake, oriented and patient cooperative  Airway & Oxygen Therapy: Patient Spontanous Breathing and Patient connected to nasal cannula oxygen  Post-op Assessment: Report given to RN and Post -op Vital signs reviewed and stable  Post vital signs: Reviewed  Last Vitals:  Vitals Value Taken Time  BP 155/73 07/31/21 0903  Temp 36.5 C 07/31/21 0903  Pulse 63 07/31/21 0903  Resp 12 07/31/21 0903  SpO2 100 % 07/31/21 0903  Vitals shown include unvalidated device data.  Last Pain:  Vitals:   07/31/21 0609  TempSrc:   PainSc: 0-No pain      Patients Stated Pain Goal: 5 (81/15/72 6203)  Complications: No notable events documented.

## 2021-07-31 NOTE — H&P (Signed)
Synopsis: Referred in Aug 2022 for lung nodules PCP: by Hulan Fess, MD   Subjective:    PATIENT ID: Virginia Rose GENDER: female DOB: Mar 21, 1946, MRN: [id number]       Chief Complaint  Patient presents with   Consult      Referred by SG for lung nodule found on a CT scan that she had 3 months ago. She is currently scheduled for a PET scan but wants to discuss if the scan is necessary. Denies any symptoms.       75 year old female, past medical history of COPD, upper lobe emphysema, longstanding history of smoking, quit in July of this past year.  Enrolled in our lung cancer screening program.  She had a lung cancer screening CT that revealed 2 right-sided nodules.  She ultimately went a follow-up CT imaging which showed persistence.  Patient now here today to discuss neck steps.  She is very concerned about having any counter treatments related to her malignancy if she was to have a lung cancer.  She saw her husband undergo a significant bout with head and neck cancer that included radiation and chemotherapy.  Ultimately he passed from this.         Past Medical History:  Diagnosis Date   Arthritis     COPD (chronic obstructive pulmonary disease) (HCC)     Hypercholesteremia     Osteoporosis     Shortness of breath      with exertion      No family history on file.         Past Surgical History:  Procedure Laterality Date   ABDOMINAL HYSTERECTOMY       APPENDECTOMY       COLONOSCOPY W/ POLYPECTOMY       EYE SURGERY        bilateral cataract removal   POSTERIOR LUMBAR FUSION 4 LEVEL N/A 08/03/2014    Procedure: THORACIC TEN-ELEVEN,THORACIC ELEVEN-TWELVE,THORACIC TWELVE-LUMBAR ONE,LUMBARTWO-THREE,LUMBAR THREE-FOUR POSTERIOR LATERAL FUSION;  Surgeon: Elaina Hoops, MD;  Location: [facility];  Service: Neurosurgery;  Laterality: N/A;  Site includes thoracic and lumbar spine      Social History         Socioeconomic History   Marital status: Widowed      Spouse name:  Not on file   Number of children: Not on file   Years of education: Not on file   Highest education level: Not on file  Occupational History   Not on file  Tobacco Use   Smoking status: Every Day      Packs/day: 0.50      Years: 48.00      Pack years: 24.00      Types: Cigarettes   Smokeless tobacco: Never  Substance and Sexual Activity   Alcohol use: Yes      Comment: occassionally drinks wine on with supper & on weekends   Drug use: No   Sexual activity: Not on file  Other Topics Concern   Not on file  Social History Narrative   Not on file    Social Determinants of Health    Financial Resource Strain: Not on file  Food Insecurity: Not on file  Transportation Needs: Not on file  Physical Activity: Not on file  Stress: Not on file  Social Connections: Not on file  Intimate Partner Violence: Not on file      No Known Allergies          Outpatient Medications Prior to Visit  Medication Sig Dispense Refill   alendronate (FOSAMAX) 70 MG tablet Take 70 mg by mouth every 7 (seven) days. Take with a full glass of water on an empty stomach. Take on Mondays       brimonidine (ALPHAGAN) 0.2 % ophthalmic solution Place 1 drop into both eyes 2 (two) times daily.       cyclobenzaprine (FLEXERIL) 10 MG tablet Take 1 tablet (10 mg total) by mouth 3 (three) times daily as needed for muscle spasms. 80 tablet 1   cyclobenzaprine (FLEXERIL) 10 MG tablet Take 1 tablet (10 mg total) by mouth 3 (three) times daily as needed for muscle spasms. 80 tablet 0   denosumab (PROLIA) 60 MG/ML SOSY injection 60 mg       dorzolamide (TRUSOPT) 2 % ophthalmic solution INSTILL 1 DROP INTO OU BID       simvastatin (ZOCOR) 40 MG tablet Take 40 mg by mouth every evening.       HYDROcodone-acetaminophen (NORCO/VICODIN) 5-325 MG per tablet Take 1 tablet by mouth every 6 (six) hours as needed for moderate pain.       hydroxypropyl methylcellulose (ISOPTO TEARS) 2.5 % ophthalmic solution Place 1 drop into both  eyes daily.       oxyCODONE-acetaminophen (PERCOCET/ROXICET) 5-325 MG per tablet Take 1-2 tablets by mouth every 4 (four) hours as needed for moderate pain. 80 tablet 0    No facility-administered medications prior to visit.      Review of Systems  Constitutional:  Negative for chills, fever, malaise/fatigue and weight loss.  HENT:  Negative for hearing loss, sore throat and tinnitus.   Eyes:  Negative for blurred vision and double vision.  Respiratory:  Negative for cough, hemoptysis, sputum production, shortness of breath, wheezing and stridor.   Cardiovascular:  Negative for chest pain, palpitations, orthopnea, leg swelling and PND.  Gastrointestinal:  Negative for abdominal pain, constipation, diarrhea, heartburn, nausea and vomiting.  Genitourinary:  Negative for dysuria, hematuria and urgency.  Musculoskeletal:  Negative for joint pain and myalgias.  Skin:  Negative for itching and rash.  Neurological:  Negative for dizziness, tingling, weakness and headaches.  Endo/Heme/Allergies:  Negative for environmental allergies. Does not bruise/bleed easily.  Psychiatric/Behavioral:  Negative for depression. The patient is not nervous/anxious and does not have insomnia.   All other systems reviewed and are negative.      Objective:   General appearance: 75 y.o., female, NAD, conversant  Eyes: PEERLA HENT: NCAT; oropharynx, MMM, no mucosal ulcerations; normal hard and soft palate Neck: Trachea midline; FROM, supple, lymphadenopathy, no JVD Lungs: CTAB, no crackles, no wheeze, with normal respiratory effort and no intercostal retractions CV: RRR, S1, S2, no MRGs  Abdomen: Soft, non-tender; non-distended, BS present  Extremities: No peripheral edema, radial and DP pulses present bilaterally  Skin: Normal temperature, turgor and texture; no rash Psych: Appropriate affect Neuro: Alert and oriented to person and place, no focal deficit          Vitals:    06/29/21 1403  BP: 118/76   Pulse: 75  SpO2: 99%  Weight: 93 lb 12.8 oz (42.5 kg)  Height: 5' 3.75" (1.619 m)    99% on RA    BMI Readings from Last 3 Encounters:  06/29/21 16.23 kg/m  08/03/14 18.48 kg/m  07/27/14 16.82 kg/m       Wt Readings from Last 3 Encounters:  06/29/21 93 lb 12.8 oz (42.5 kg)  08/03/14 106 lb 12.8 oz (48.4 kg)  07/27/14 97 lb 3.2 oz (  44.1 kg)        CBC Labs (Brief)          Component Value Date/Time    WBC 7.2 08/08/2014 0947    RBC 3.51 (L) 08/08/2014 0947    HGB 10.9 (L) 08/08/2014 0947    HCT 31.5 (L) 08/08/2014 0947    PLT 191 08/08/2014 0947    MCV 89.7 08/08/2014 0947    MCH 31.1 08/08/2014 0947    MCHC 34.6 08/08/2014 0947    RDW 15.0 08/08/2014 0947    LYMPHSABS 1.6 08/08/2014 0947    MONOABS 0.6 08/08/2014 0947    EOSABS 0.2 08/08/2014 0947    BASOSABS 0.0 08/08/2014 0947          Chest Imaging:   July CT chest: This was completed at [facility].  I was able to view images in PACS system.  CT imaging reveals 2 right upper lobe pulmonary nodules a solid nodule and then a subsolid groundglass peripheral nodule.  Both concerning for potential underlying malignancy. The patient's images have been independently reviewed by me.     Pulmonary Functions Testing Results: No flowsheet data found.   FeNO:    Pathology:    Echocardiogram:    Heart Catheterization:     Assessment & Plan:        ICD-10-CM    1. Lung nodules  R91.8 Ambulatory referral to Pulmonology      Procedural/ Surgical Case Request: VIDEO BRONCHOSCOPY WITH ENDOBRONCHIAL NAVIGATION      CT Super D Chest Wo Contrast      NM PET Image Initial (PI) Skull Base To Thigh (F-18 FDG)     2. Former smoker  Z87.891       3. Centrilobular emphysema ([place])  J43.2           Discussion:   Lung nodules  Plan: Discussed risks benefts and alternatives  Plans for robotic bronchoscopy  No barriers    Garner Nash, DO [facility] 07/31/2021 7:13 AM

## 2021-07-31 NOTE — Op Note (Signed)
Video Bronchoscopy with Robotic Assisted Bronchoscopic Navigation   Date of Operation: 07/31/2021   Pre-op Diagnosis: Pulmonary nodules  Post-op Diagnosis: Pulmonary nodules  Surgeon: Garner Nash, DO  Assistants: none  Anesthesia: General endotracheal anesthesia  Operation: Flexible video fiberoptic bronchoscopy with robotic assistance and biopsies.  Estimated Blood Loss: Minimal  Complications: None  Indications and History: Virginia Rose is a 75 y.o. female with history of right upper lobe pulmonary nodules. The risks, benefits, complications, treatment options and expected outcomes were discussed with the patient.  The possibilities of pneumothorax, pneumonia, reaction to medication, pulmonary aspiration, perforation of a viscus, bleeding, failure to diagnose a condition and creating a complication requiring transfusion or operation were discussed with the patient who freely signed the consent.    Description of Procedure: The patient was seen in the Preoperative Area, was examined and was deemed appropriate to proceed.  The patient was taken to [facility] endoscopy room 3, identified as Cheyenne Adas and the procedure verified as Flexible Video Fiberoptic Bronchoscopy.  A Time Out was held and the above information confirmed.   Prior to the date of the procedure a high-resolution CT scan of the chest was performed. Utilizing ION software program a virtual tracheobronchial tree was generated to allow the creation of distinct navigation pathways to the patient's parenchymal abnormalities. After being taken to the operating room general anesthesia was initiated and the patient  was orally intubated. The video fiberoptic bronchoscope was introduced via the endotracheal tube and a general inspection was performed which showed normal right and left lung anatomy, aspiration of the bilateral mainstems was completed to remove any remaining secretions. Robotic catheter inserted into patient's  endotracheal tube.   Target #1 right upper lobe apical: The distinct navigation pathways prepared prior to this procedure were then utilized to navigate to patient's lesion identified on CT scan. The robotic catheter was secured into place and the vision probe was withdrawn.  Lesion location was approximated using fluoroscopy and radial endobronchial ultrasound for peripheral targeting. Under fluoroscopic guidance transbronchial needle brushings, transbronchial needle biopsies, and transbronchial forceps biopsies were performed to be sent for cytology and pathology.  Following tissue sampling a single fiducial was placed in proximity of the lesion using fiducial catheter and wire delivery kit.  Target #2 right upper lobe lateral: The distinct navigation pathways prepared prior to this procedure were then utilized to navigate to patient's lesion identified on CT scan. The robotic catheter was secured into place and the vision probe was withdrawn.  Lesion location was approximated using fluoroscopy and radial endobronchial ultrasound for peripheral targeting. Under fluoroscopic guidance transbronchial needle brushings, transbronchial needle biopsies, and transbronchial forceps biopsies were performed to be sent for cytology and pathology.  Following tissue sampling a single fiducial was placed in proximity of the lesion with a fiducial catheter and wire delivery kit.  A bronchioalveolar lavage was performed in the right upper lobe and sent for cultures.  At the end of the procedure a general airway inspection was performed and there was no evidence of active bleeding. The bronchoscope was removed.  The patient tolerated the procedure well. There was no significant blood loss and there were no obvious complications. A post-procedural chest x-ray is pending.  Samples Target #1: 1. Transbronchial needle brushings from right upper lobe apical 2. Transbronchial Wang needle biopsies from right upper lobe  apical 3. Transbronchial forceps biopsies from right upper lobe apical  Samples Target #2: 1. Transbronchial needle brushings from right upper lobe lateral 2.  Transbronchial Wang needle biopsies from right upper lobe lateral 3. Transbronchial forceps biopsies from right upper lobe lateral 4. Bronchoalveolar lavage from right upper lobe  Plans:  The patient will be discharged from the PACU to home when recovered from anesthesia and after chest x-ray is reviewed. We will review the cytology, pathology and microbiology results with the patient when they become available. Outpatient followup will be with Garner Nash, DO.   Garner Nash, DO [facility] 07/31/2021 8:54 AM

## 2021-07-31 NOTE — Anesthesia Procedure Notes (Signed)
Procedure Name: Intubation Date/Time: 07/31/2021 7:39 AM Performed by: Jenne Campus, CRNA Pre-anesthesia Checklist: Patient identified, Emergency Drugs available, Suction available and Patient being monitored Patient Re-evaluated:Patient Re-evaluated prior to induction Oxygen Delivery Method: Circle System Utilized Preoxygenation: Pre-oxygenation with 100% oxygen Induction Type: IV induction Ventilation: Mask ventilation without difficulty Laryngoscope Size: Miller and 2 Grade View: Grade I Tube type: Oral Tube size: 8.5 mm Number of attempts: 1 Airway Equipment and Method: Stylet Placement Confirmation: ETT inserted through vocal cords under direct vision, positive ETCO2 and breath sounds checked- equal and bilateral Secured at: 22 cm Tube secured with: Tape Dental Injury: Teeth and Oropharynx as per pre-operative assessment

## 2021-07-31 NOTE — Progress Notes (Signed)
Small PTX post biopsy evacuated with catheter. Repeat CXR around 1:30 (3 hr), if stable can DC. Discussed with Icard  Erskine Emery MD PCCM

## 2021-07-31 NOTE — Discharge Instructions (Signed)
Flexible Bronchoscopy, Care After This sheet gives you information about how to care for yourself after your test. Your doctor may also give you more specific instructions. If you have problems or questions, contact your doctor. Follow these instructions at home: Eating and drinking Do not eat or drink anything (not even water) for 2 hours after your test, or until your numbing medicine (local anesthetic) wears off. When your numbness is gone and your cough and gag reflexes have come back, you may: Eat only soft foods. Slowly drink liquids. The day after the test, go back to your normal diet. Driving Do not drive for 24 hours if you were given a medicine to help you relax (sedative). Do not drive or use heavy machinery while taking prescription pain medicine. General instructions  Take over-the-counter and prescription medicines only as told by your doctor. Return to your normal activities as told. Ask what activities are safe for you. Do not use any products that have nicotine or tobacco in them. This includes cigarettes and e-cigarettes. If you need help quitting, ask your doctor. Keep all follow-up visits as told by your doctor. This is important. It is very important if you had a tissue sample (biopsy) taken. Get help right away if: You have shortness of breath that gets worse. You get light-headed. You feel like you are going to pass out (faint). You have chest pain. You cough up: More than a little blood. More blood than before. Summary Do not eat or drink anything (not even water) for 2 hours after your test, or until your numbing medicine wears off. Do not use cigarettes. Do not use e-cigarettes. Get help right away if you have chest pain.  This information is not intended to replace advice given to you by your health care provider. Make sure you discuss any questions you have with your health care provider. Document Released: 08/25/2009 Document Revised: 10/10/2017 Document  Reviewed: 11/15/2016 Elsevier Patient Education  2020 [employer].

## 2021-07-31 NOTE — Telephone Encounter (Signed)
Received call report on cxr 1 view from 07/31/21- impression below and forwarding to Dr Valeta Harms to be aware   IMPRESSION: Small to moderate right pneumothorax post biopsy.   Patchy right mid lung opacity with 2 clips probably reflects biopsy changes.   These results will be called to the ordering clinician or representative by the Radiologist Assistant, and communication documented in the PACS or [employer].     Electronically Signed   By: Macy Mis M.D.   On: 07/31/2021 09:38

## 2021-07-31 NOTE — Progress Notes (Signed)
Tolerated procedure well / bandaids over site by dr Tamala Julian / no cepitus noted, denies pain / taken to bathroom then mo0ved to room 16 , accompanied by daughter in law/ [place] . Will be staying in Phase 2 pending f/u cxr at 1330 in anticipation of d/c home.

## 2021-07-31 NOTE — Anesthesia Postprocedure Evaluation (Signed)
Anesthesia Post Note  Patient: Virginia Rose  Procedure(s) Performed: VIDEO BRONCHOSCOPY WITH ENDOBRONCHIAL NAVIGATION BRONCHIAL BIOPSIES BRONCHIAL BRUSHINGS BRONCHIAL NEEDLE ASPIRATION BIOPSIES RADIAL ENDOBRONCHIAL ULTRASOUND BRONCHIAL WASHINGS FIDUCIAL MARKER PLACEMENT     Anesthesia Post Evaluation No notable events documented.  Last Vitals:  Vitals:   07/31/21 0933 07/31/21 0949  BP: (!) 147/69   Pulse: 66 71  Resp: 14   Temp: 36.9 C   SpO2: 96% 95%    Last Pain:  Vitals:   07/31/21 0933  TempSrc:   PainSc: 0-No pain                 Skiler Tye

## 2021-07-31 NOTE — Interval H&P Note (Signed)
History and Physical Interval Note:  07/31/2021 7:16 AM  Virginia Rose  has presented today for surgery, with the diagnosis of lung nodules.  The various methods of treatment have been discussed with the patient and family. After consideration of risks, benefits and other options for treatment, the patient has consented to  Procedure(s) with comments: [place] (N/A) - ION as a surgical intervention.  The patient's history has been reviewed, patient examined, no change in status, stable for surgery.  I have reviewed the patient's chart and labs.  Questions were answered to the patient's satisfaction.     [place]

## 2021-07-31 NOTE — Telephone Encounter (Signed)
Tiny residual PTX on CXR in PACU.  Will get final one tomorrow AM at office to make sure continued stability.  Patient and family was instructed on s/s to watch out for (chest pain, pleurisy etc).  Office to call Icard when patient arrives.

## 2021-07-31 NOTE — Telephone Encounter (Signed)
Garner Nash, DO  Rosana Berger, CMA Caller: Unspecified (Today,  9:47 AM) Thanks I am aware  BLI

## 2021-08-01 ENCOUNTER — Ambulatory Visit (INDEPENDENT_AMBULATORY_CARE_PROVIDER_SITE_OTHER): Payer: Medicare Other | Admitting: Pulmonary Disease

## 2021-08-01 ENCOUNTER — Telehealth: Payer: Self-pay | Admitting: Pulmonary Disease

## 2021-08-01 ENCOUNTER — Ambulatory Visit (INDEPENDENT_AMBULATORY_CARE_PROVIDER_SITE_OTHER): Payer: Medicare Other

## 2021-08-01 ENCOUNTER — Ambulatory Visit: Payer: Medicare Other

## 2021-08-01 ENCOUNTER — Encounter (HOSPITAL_COMMUNITY): Payer: Self-pay | Admitting: Pulmonary Disease

## 2021-08-01 DIAGNOSIS — J95811 Postprocedural pneumothorax: Secondary | ICD-10-CM | POA: Diagnosis not present

## 2021-08-01 DIAGNOSIS — R918 Other nonspecific abnormal finding of lung field: Secondary | ICD-10-CM

## 2021-08-01 DIAGNOSIS — J432 Centrilobular emphysema: Secondary | ICD-10-CM

## 2021-08-01 LAB — ACID FAST SMEAR (AFB, MYCOBACTERIA): Acid Fast Smear: NEGATIVE

## 2021-08-01 NOTE — Telephone Encounter (Signed)
Noted  

## 2021-08-01 NOTE — Telephone Encounter (Signed)
Call report sent to Dr. Valeta Harms  IMPRESSION: 1. Increased size of the right pneumothorax, now moderate 2. Mild nodular opacities in the right midlung in the region of 2 clips, compatible with known pulmonary nodules and possibly superimposed post biopsy change. 3. Emphysema.   These results will be called to the ordering clinician or representative by the Radiologist Assistant, and communication documented in the PACS or [employer].     Electronically Signed   By: Margaretha Sheffield M.D.   On: 08/01/2021 10:07

## 2021-08-01 NOTE — Patient Instructions (Signed)
Thank you for visiting Dr. Valeta Harms at [facility]. Today we recommend the following:  CXR tomorrow at [place] Having someone to stay with you Discussed warning symptoms again  Call 911 if develops chest pain or worsening shortness of breath  Return tomorrow with repeat CXR at [facility].    Please do your part to reduce the spread of COVID-19.

## 2021-08-01 NOTE — Telephone Encounter (Signed)
2 v order placed

## 2021-08-01 NOTE — Progress Notes (Signed)
Synopsis: Referred in Aug 2022 for lung nodules PCP: by [facility]  Subjective:   PATIENT ID: Virginia Rose GENDER: female DOB: [birth year], MRN: [id number]  Chief Complaint  Patient presents with  . Procedure     75 year old female, past medical history of COPD, upper lobe emphysema, longstanding history of smoking, quit in July of this past year.  Enrolled in our lung cancer screening program.  She had a lung cancer screening CT that revealed 2 right-sided nodules.  She ultimately went a follow-up CT imaging which showed persistence.  Patient now here today to discuss neck steps.  She is very concerned about having any counter treatments related to her malignancy if she was to have a lung cancer.  She saw her husband undergo a significant bout with head and neck cancer that included radiation and chemotherapy.  Ultimately he passed from this.  OV 08/01/2021: Here today for follow-up after bronchoscopy.  Patient was taken yesterday for robotic assisted bronchoscopy for biopsy of 2 nodules.  The apical nodule in the room was read as adequate concerning for malignancy as well as a more anterior fissural based nodule on the right side.  Unfortunately she suffered a postprocedural pneumothorax.  This was drained yesterday in PACU.  Post film had a stable small apical pneumothorax.  She was asymptomatic with stable O2 sats and was discharged home for follow-up this morning with a repeat chest x-ray.  This was completed prior to the office visit.  Unfortunately shows some expansion of the pneumothorax however it still is small in size.  Patient is also asymptomatic.   Past Medical History:  Diagnosis Date  . Arthritis   . Complication of anesthesia   . COPD (chronic obstructive pulmonary disease) ([place])   . History of blood transfusion    with back surgery procedure  . Hypercholesteremia   . Memory loss   . Osteoporosis   . Shortness of breath    with exertion, no oxygen  .  Smoker    quit 05/14/21  - 1ppd - pt started smoking at the age 34yrs old     No family history on file.   Past Surgical History:  Procedure Laterality Date  . ABDOMINAL HYSTERECTOMY    . APPENDECTOMY    . COLONOSCOPY W/ POLYPECTOMY    . EYE SURGERY     bilateral cataract removal  . POSTERIOR LUMBAR FUSION 4 LEVEL N/A 08/03/2014   Procedure: THORACIC TEN-ELEVEN,THORACIC ELEVEN-TWELVE,THORACIC TWELVE-LUMBAR ONE,LUMBARTWO-THREE,LUMBAR THREE-FOUR POSTERIOR LATERAL FUSION;  Surgeon: Elaina Hoops, MD;  Location: [facility];  Service: Neurosurgery;  Laterality: N/A;  Site includes thoracic and lumbar spine    Social History   Socioeconomic History  . Marital status: Widowed    Spouse name: Not on file  . Number of children: Not on file  . Years of education: Not on file  . Highest education level: Not on file  Occupational History  . Not on file  Tobacco Use  . Smoking status: Former    Packs/day: 0.50    Years: 48.00    Pack years: 24.00    Types: Cigarettes    Quit date: 06/03/2021    Years since quitting: 0.1  . Smokeless tobacco: Never  Vaping Use  . Vaping Use: Never used  Substance and Sexual Activity  . Alcohol use: Yes    Comment: drinks wine daily & on weekends  . Drug use: No  . Sexual activity: Not Currently    Birth control/protection:  Surgical    Comment: Hysterectomy  Other Topics Concern  . Not on file  Social History Narrative  . Not on file   Social Determinants of Health   Financial Resource Strain: Not on file  Food Insecurity: Not on file  Transportation Needs: Not on file  Physical Activity: Not on file  Stress: Not on file  Social Connections: Not on file  Intimate Partner Violence: Not on file     Allergies  Allergen Reactions  . Latex     Band-aids, pull off skin     Facility-Administered Medications Prior to Visit  Medication Dose Route Frequency Provider Last Rate Last Admin  . acetaminophen (TYLENOL) tablet 325-650 mg  325-650 mg  Oral Q4H PRN Janeece Riggers, MD       Or  . acetaminophen (TYLENOL) 160 MG/5ML solution 325-650 mg  325-650 mg Oral Q4H PRN Janeece Riggers, MD      . fentaNYL (SUBLIMAZE) injection 25-50 mcg  25-50 mcg Intravenous Q5 min PRN Janeece Riggers, MD   50 mcg at 07/31/21 1022  . lactated ringers infusion   Intravenous Continuous Janeece Riggers, MD   New Bag at 07/31/21 [phone]  . meperidine (DEMEROL) injection 6.25-12.5 mg  6.25-12.5 mg Intravenous Q5 min PRN Janeece Riggers, MD      . ondansetron [facility]) injection 4 mg  4 mg Intravenous Once PRN Janeece Riggers, MD      . oxyCODONE (Oxy IR/ROXICODONE) immediate release tablet 5 mg  5 mg Oral Once PRN Janeece Riggers, MD       Or  . oxyCODONE (ROXICODONE) 5 MG/5ML solution 5 mg  5 mg Oral Once PRN Janeece Riggers, MD       Outpatient Medications Prior to Visit  Medication Sig Dispense Refill  . brimonidine (ALPHAGAN) 0.2 % ophthalmic solution Place 1 drop into both eyes 2 (two) times daily.    Marland Kitchen CALCIUM-VITAMIN D PO Take 2 tablets by mouth daily.    . cyclobenzaprine (FLEXERIL) 10 MG tablet Take 1 tablet (10 mg total) by mouth 3 (three) times daily as needed for muscle spasms. (Patient not taking: Reported on 07/27/2021) 80 tablet 1  . cyclobenzaprine (FLEXERIL) 10 MG tablet Take 1 tablet (10 mg total) by mouth 3 (three) times daily as needed for muscle spasms. (Patient not taking: Reported on 07/27/2021) 80 tablet 0  . denosumab (PROLIA) 60 MG/ML SOSY injection Inject 60 mg into the skin every 6 (six) months.    . dorzolamide (TRUSOPT) 2 % ophthalmic solution Place 1 drop into both eyes 2 (two) times daily.      Review of Systems  Constitutional:  Negative for chills, fever, malaise/fatigue and weight loss.  HENT:  Negative for hearing loss, sore throat and tinnitus.   Eyes:  Negative for blurred vision and double vision.  Respiratory:  Negative for cough, hemoptysis, sputum production, shortness of breath, wheezing and stridor.   Cardiovascular:   Negative for chest pain, palpitations, orthopnea, leg swelling and PND.  Gastrointestinal:  Negative for abdominal pain, constipation, diarrhea, heartburn, nausea and vomiting.  Genitourinary:  Negative for dysuria, hematuria and urgency.  Musculoskeletal:  Negative for joint pain and myalgias.  Skin:  Negative for itching and rash.  Neurological:  Negative for dizziness, tingling, weakness and headaches.  Endo/Heme/Allergies:  Negative for environmental allergies. Does not bruise/bleed easily.  Psychiatric/Behavioral:  Negative for depression. The patient is not nervous/anxious and does not have insomnia.   All other systems reviewed and are negative.   Objective:  Physical  Exam Vitals reviewed.  Constitutional:      General: She is not in acute distress.    Appearance: She is well-developed.  HENT:     Head: Normocephalic and atraumatic.     Mouth/Throat:     Pharynx: No oropharyngeal exudate.  Eyes:     Conjunctiva/sclera: Conjunctivae normal.     Pupils: Pupils are equal, round, and reactive to light.  Neck:     Vascular: No JVD.     Trachea: No tracheal deviation.     Comments: Loss of supraclavicular fat Cardiovascular:     Rate and Rhythm: Normal rate and regular rhythm.     Heart sounds: S1 normal and S2 normal.     Comments: Distant heart tones Pulmonary:     Effort: No tachypnea or accessory muscle usage.     Breath sounds: No stridor. Decreased breath sounds (throughout all lung fields) present. No wheezing, rhonchi or rales.  Abdominal:     General: Bowel sounds are normal. There is no distension.     Palpations: Abdomen is soft.     Tenderness: There is no abdominal tenderness.  Musculoskeletal:        General: Deformity (muscle wasting ) present.  Skin:    General: Skin is warm and dry.     Capillary Refill: Capillary refill takes less than 2 seconds.     Findings: No rash.  Neurological:     Mental Status: She is alert and oriented to person, place, and  time.  Psychiatric:        Behavior: Behavior normal.     There were no vitals filed for this visit.    on RA BMI Readings from Last 3 Encounters:  07/31/21 17.01 kg/m  06/29/21 16.23 kg/m  08/03/14 18.48 kg/m   Wt Readings from Last 3 Encounters:  07/31/21 93 lb (42.2 kg)  06/29/21 93 lb 12.8 oz (42.5 kg)  08/03/14 106 lb 12.8 oz (48.4 kg)     CBC    Component Value Date/Time   WBC 6.5 07/31/2021 0552   RBC 3.76 (L) 07/31/2021 0552   HGB 12.0 07/31/2021 0552   HCT 36.1 07/31/2021 0552   PLT 269 07/31/2021 0552   MCV 96.0 07/31/2021 0552   MCH 31.9 07/31/2021 0552   MCHC 33.2 07/31/2021 0552   RDW 12.5 07/31/2021 0552   LYMPHSABS 1.6 08/08/2014 0947   MONOABS 0.6 08/08/2014 0947   EOSABS 0.2 08/08/2014 0947   BASOSABS 0.0 08/08/2014 0947     Chest Imaging:  July CT chest: This was completed at [facility].  I was able to view images in PACS system.  CT imaging reveals 2 right upper lobe pulmonary nodules a solid nodule and then a subsolid groundglass peripheral nodule.  Both concerning for potential underlying malignancy. The patient's images have been independently reviewed by me.    Pulmonary Functions Testing Results: No flowsheet data found.  FeNO:   Pathology:   Echocardiogram:   Heart Catheterization:     Assessment & Plan:     ICD-10-CM   1. Pneumothorax after biopsy  J95.811 DG Chest 2 View    2. Lung nodules  R91.8     3. Centrilobular emphysema ([place])  J43.2       Discussion:  This is a 75 year old female, likely has COPD have evidence of centrilobular emphysema.  Had an abnormal lung cancer screening CT which revealed 2 nodules, PET scan also concerning for potential for metachronous primary.  Patient was taken for robotic  assisted bronchoscopy and tissue sampling on 07/31/2021.  Unfortunately suffered a post biopsy pneumothorax.  This was treated conservatively with catheter drainage and aspiration of air.  Patient tolerated  well was discharged yesterday.  Patient follows up today with repeat chest x-ray that showed small amount of enlargement of the pneumothorax.  Plan: Patient remains asymptomatic. We discussed this today in the office regarding management of pneumothorax. She is not interested in being admitted to the hospital and would like to continue management outpatient if possible. We discussed the risks of this as well as the warning symptoms required for her to let us know if she is having any difficulty breathing or worsening chest pains. She also needs to have somebody staying with her 24/7.  She has a friend today in the office with her that has agreed to stay with her and spend the night tonight. I explained that if she does have any change in her symptoms whatsoever that she call 911 and let them know that she has this going on in the right chest and we have been monitoring it. I think we can continue conservative management. We will give her oxygen today in the office with an attempt to keep her O2 sats above 100%. She has plans for repeat chest x-ray tomorrow morning. We will readdress the issue tomorrow morning pending any change in the pneumothorax tomorrow.   No current facility-administered medications for this visit. No current outpatient medications on file.  Facility-Administered Medications Ordered in Other Visits:  .  acetaminophen (TYLENOL) tablet 325-650 mg, 325-650 mg, Oral, Q4H PRN **OR** acetaminophen (TYLENOL) 160 MG/5ML solution 325-650 mg, 325-650 mg, Oral, Q4H PRN, Janeece Riggers, MD .  fentaNYL (SUBLIMAZE) injection 25-50 mcg, 25-50 mcg, Intravenous, Q5 min PRN, Janeece Riggers, MD, 50 mcg at 07/31/21 1022 .  lactated ringers infusion, , Intravenous, Continuous, Janeece Riggers, MD, New Bag at 07/31/21 [phone] .  meperidine (DEMEROL) injection 6.25-12.5 mg, 6.25-12.5 mg, Intravenous, Q5 min PRN, Janeece Riggers, MD .  ondansetron [facility]) injection 4 mg, 4 mg, Intravenous, Once PRN,  Janeece Riggers, MD .  oxyCODONE (Oxy IR/ROXICODONE) immediate release tablet 5 mg, 5 mg, Oral, Once PRN **OR** oxyCODONE (ROXICODONE) 5 MG/5ML solution 5 mg, 5 mg, Oral, Once PRN, Janeece Riggers, MD  I spent 42 minutes dedicated to the care of this patient on the date of this encounter to include pre-visit review of records, face-to-face time with the patient discussing conditions above, post visit ordering of testing, clinical documentation with the electronic health record, making appropriate referrals as documented, and communicating necessary findings to members of the patients care team.    Garner Nash, DO [facility] 08/01/2021 10:58 AM

## 2021-08-01 NOTE — Addendum Note (Signed)
Addended by: Garner Nash on: 08/01/2021 09:29 AM   Modules accepted: Orders

## 2021-08-02 ENCOUNTER — Other Ambulatory Visit: Payer: Self-pay

## 2021-08-02 ENCOUNTER — Ambulatory Visit (HOSPITAL_BASED_OUTPATIENT_CLINIC_OR_DEPARTMENT_OTHER)
Admission: RE | Admit: 2021-08-02 | Discharge: 2021-08-02 | Disposition: A | Discharge status: Home / Self Care | Payer: Medicare Other | Source: Ambulatory Visit | Attending: Pulmonary Disease | Admitting: Pulmonary Disease

## 2021-08-02 DIAGNOSIS — J95811 Postprocedural pneumothorax: Secondary | ICD-10-CM

## 2021-08-02 DIAGNOSIS — J939 Pneumothorax, unspecified: Secondary | ICD-10-CM | POA: Diagnosis not present

## 2021-08-02 LAB — CYTOLOGY - NON PAP

## 2021-08-05 LAB — ANAEROBIC CULTURE W GRAM STAIN

## 2021-08-10 ENCOUNTER — Telehealth: Payer: Self-pay | Admitting: *Deleted

## 2021-08-10 ENCOUNTER — Ambulatory Visit (INDEPENDENT_AMBULATORY_CARE_PROVIDER_SITE_OTHER)
Admission: RE | Admit: 2021-08-10 | Discharge: 2021-08-10 | Disposition: A | Discharge status: Home / Self Care | Payer: Medicare Other | Source: Ambulatory Visit | Attending: Adult Health | Admitting: Adult Health

## 2021-08-10 ENCOUNTER — Encounter: Payer: Self-pay | Admitting: Adult Health

## 2021-08-10 ENCOUNTER — Other Ambulatory Visit: Payer: Self-pay

## 2021-08-10 ENCOUNTER — Ambulatory Visit: Payer: Medicare Other | Admitting: Adult Health

## 2021-08-10 VITALS — BP 130/64 | HR 82 | Temp 98.0°F | Ht 62.0 in | Wt 94.4 lb

## 2021-08-10 DIAGNOSIS — J95811 Postprocedural pneumothorax: Secondary | ICD-10-CM | POA: Insufficient documentation

## 2021-08-10 DIAGNOSIS — Z23 Encounter for immunization: Secondary | ICD-10-CM

## 2021-08-10 DIAGNOSIS — C3411 Malignant neoplasm of upper lobe, right bronchus or lung: Secondary | ICD-10-CM

## 2021-08-10 DIAGNOSIS — E440 Moderate protein-calorie malnutrition: Secondary | ICD-10-CM

## 2021-08-10 DIAGNOSIS — Z87891 Personal history of nicotine dependence: Secondary | ICD-10-CM

## 2021-08-10 DIAGNOSIS — J432 Centrilobular emphysema: Secondary | ICD-10-CM

## 2021-08-10 DIAGNOSIS — R0602 Shortness of breath: Secondary | ICD-10-CM | POA: Diagnosis not present

## 2021-08-10 DIAGNOSIS — Z122 Encounter for screening for malignant neoplasm of respiratory organs: Secondary | ICD-10-CM

## 2021-08-10 DIAGNOSIS — C3490 Malignant neoplasm of unspecified part of unspecified bronchus or lung: Secondary | ICD-10-CM | POA: Insufficient documentation

## 2021-08-10 NOTE — Assessment & Plan Note (Signed)
2 right upper lobe lung nodules hypermetabolic on PET underwent robotic assisted bronchoscopy with positive cytology for non-small cell carcinoma. Review CT scan and PET scan along with bronchoscopy cytology results consistent with underlying lung cancer.  PET scan with no evidence of distant disease. Will discuss case further with Dr. Valeta Harms however do not feel patient would be a significant surgical candidate as she has low BMI most likely significant emphysema.  Unable to do PFTs at this time due to her recent pneumothorax. For now we will refer to oncology and radiation oncology may just need SBRT going forward..  Plan  Patient Instructions  Chest xray today  Refer to Oncology and Radiation Oncology .  Flu shot today  Follow up with Dr. Valeta Harms or Ladarryl Wrage NP in 4 weeks and As needed

## 2021-08-10 NOTE — Telephone Encounter (Signed)
I received referral on Virginia Rose. I called and spoke to her. She is scheduled to be seen Monday Oct 3.  She is aware of appt time and location.

## 2021-08-10 NOTE — Assessment & Plan Note (Signed)
Postprocedural pneumothorax.  Patient has no significant symptoms.  We will repeat chest x-ray today.  I have advised her on light activities only no heavy lifting or strenuous activities. Pending chest x-ray result we will decide on sooner follow-up for now can follow-up in 4 weeks and as needed  Plan  Patient Instructions  Chest xray today  Refer to Oncology and Radiation Oncology .  Flu shot today  Follow up with Dr. Valeta Harms or Camilah Spillman NP in 4 weeks and As needed

## 2021-08-10 NOTE — Patient Instructions (Addendum)
Chest xray today  Refer to Oncology and Radiation Oncology .  Flu shot today  Follow up with Dr. Valeta Harms or Jaquin Coy NP in 4 weeks and As needed

## 2021-08-10 NOTE — Progress Notes (Signed)
@Patient  ID: Virginia Rose, female    DOB: 1946-07-16, 75 y.o.   MRN: [id number]  Chief Complaint  Patient presents with   Follow-up    Referring provider: No ref. provider found  HPI: 75 year old female former smoker quit 2022.  She has underlying COPD with emphysema.  Seen for pulmonary consult June 29, 2021 for lung nodules.  Underwent robotic assisted bronchoscopy for biopsy of 2 lung nodules in the right upper lobe.  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  TEST/EVENTS :  PET scan July 24, 5783 hypermetabolic right upper lobe nodule 1.4 x 0.8 cm, and hypermetabolic 2.3 x 1.5 subsolid pulmonary nodule in the basilar right lower lobe.,  No evidence of hypermetabolic metastatic disease in the chest or elsewhere. Notable 4.4 cm right thyroid lesion.  With no FDG uptake.  Has had previous thyroid ultrasound.  CT chest Super D July 25, 2021 showed a spiculated nodule in the right upper lobe measuring 1.3 x 0.8 cm, subsolid nodule in the right upper lobe 2.2 x 1.5 cm.,  Emphysema.  08/01/19 Bronch cytology -FINAL MICROSCOPIC DIAGNOSIS:  A. LUNG, RUL, BRUSHING:  - Malignant cells consistent with non-small cell carcinoma   B. LUNG, RUL, FINE NEEDLE ASPIRATION:  - Malignant cells consistent with non-small cell carcinoma  - See comment   C. LUNG, RUL TARGET #2 , BRUSHING:  - Rare atypical cells   D. LUNG, RUL TARGET #2 , FINE NEEDLE ASPIRATION:  - Rare atypical cells   ADDENDUM:  Immunohistochemistry shows there is focal, weak positivity  with p63.  TTF-1 and cytokeratin 5/6 are negative.  The immunophenotype  is nonspecific.    08/10/2021 Follow up : Lung cancer, Pneumothorax  Patient returns for a 1 week follow-up.  Patient was seen last month for a pulmonary consult for lung nodules found on low-dose CT chest.  CT chest showed 2 right upper lobe lung nodules.  PET scan showed hypermetabolic activity in both nodules.  Patient underwent a robotic assisted  bronchoscopy September 2022.  Cytology returned positive for non-small cell carcinoma.  Fiduciary markers were placed .  Unfortunately she suffered a postprocedural pneumothorax.  This was drained in the PACU.  Patient was seen back in the office last week with no significant symptoms.  She had no significant chest pain or shortness of breath.  Chest x-ray showed a small to moderate right sided pneumothorax.  Today patient says she is doing well she has no chest pain or shortness of breath.  No change in activity levels. Today patient is accompanied by her son and daughter-in-law.  They wanted to go over her CT scan, PET scan and her cytology results.  We went over all of her results in detail and went over potential treatment options.   Patient is 75 years old.  Has a history of significant heavy smoking.  She quit earlier this year.  Has significant emphysema on exam.  She does not have any significant shortness of breath or coughing and has not been worked up for COPD with pulmonary function testing in the past.  Her weight is 94 pounds.  Has had some weight loss over the last year.  BMI is 17.  We discussed treatment options with patient and family.  With patient's age, most likely significant underlying emphysema and low weight would be concerned that she would be a surgical candidate.  We will refer to oncology and radiation oncology for further evaluation and treatment plan.     Allergies  Allergen Reactions   Latex     Band-aids, pull off skin    Immunization History  Administered Date(s) Administered   Fluad Quad(high Dose 65+) 08/25/2019, 08/10/2021   Influenza, High Dose Seasonal PF 08/19/2018   Influenza,inj,Quad PF,6+ Mos 08/04/2014   Moderna Sars-Covid-2 Vaccination 01/06/2020, 02/09/2020    Past Medical History:  Diagnosis Date   Arthritis    Complication of anesthesia    COPD (chronic obstructive pulmonary disease) ([place])    History of blood transfusion    with back surgery  procedure   Hypercholesteremia    Memory loss    Osteoporosis    Shortness of breath    with exertion, no oxygen   Smoker    quit 05/14/21  - 1ppd - pt started smoking at the age 75yrs old    Tobacco History: Social History   Tobacco Use  Smoking Status Former   Packs/day: 0.50   Years: 48.00   Pack years: 24.00   Types: Cigarettes   Quit date: 06/03/2021   Years since quitting: 0.1  Smokeless Tobacco Never   Counseling given: Not Answered   Outpatient Medications Prior to Visit  Medication Sig Dispense Refill   brimonidine (ALPHAGAN) 0.2 % ophthalmic solution Place 1 drop into both eyes 2 (two) times daily.     CALCIUM-VITAMIN D PO Take 2 tablets by mouth daily.     denosumab (PROLIA) 60 MG/ML SOSY injection Inject 60 mg into the skin every 6 (six) months.     dorzolamide (TRUSOPT) 2 % ophthalmic solution Place 1 drop into both eyes 2 (two) times daily.     cyclobenzaprine (FLEXERIL) 10 MG tablet Take 1 tablet (10 mg total) by mouth 3 (three) times daily as needed for muscle spasms. (Patient not taking: Reported on 07/27/2021) 80 tablet 1   cyclobenzaprine (FLEXERIL) 10 MG tablet Take 1 tablet (10 mg total) by mouth 3 (three) times daily as needed for muscle spasms. (Patient not taking: Reported on 07/27/2021) 80 tablet 0   No facility-administered medications prior to visit.     Review of Systems:   Constitutional:   No  weight loss, night sweats,  Fevers, chills, fatigue, or  lassitude.  HEENT:   No headaches,  Difficulty swallowing,  Tooth/dental problems, or  Sore throat,                No sneezing, itching, ear ache, nasal congestion, post nasal drip,   CV:  No chest pain,  Orthopnea, PND, swelling in lower extremities, anasarca, dizziness, palpitations, syncope.   GI  No heartburn, indigestion, abdominal pain, nausea, vomiting, diarrhea, change in bowel habits, loss of appetite, bloody stools.   Resp: No shortness of breath with exertion or at rest.  No excess  mucus, no productive cough,  No non-productive cough,  No coughing up of blood.  No change in color of mucus.  No wheezing.  No chest wall deformity  Skin: no rash or lesions.  GU: no dysuria, change in color of urine, no urgency or frequency.  No flank pain, no hematuria   MS:  No joint pain or swelling.  No decreased range of motion.  No back pain.    Physical Exam  BP 130/64 (BP Location: Left Arm, Patient Position: Sitting, Cuff Size: Normal)   Pulse 82   Temp 98 F (36.7 C) (Oral)   Ht 5\' 2"  (1.575 m)   Wt 94 lb 6.4 oz (42.8 kg)   SpO2 98%   BMI 17.27 kg/m  GEN: A/Ox3; pleasant , NAD thin female   HEENT:  /AT,  NOSE-clear, THROAT-clear, no lesions, no postnasal drip or exudate noted.   NECK:  Supple w/ fair ROM; no JVD; normal carotid impulses w/o bruits; no thyromegaly or nodules palpated; no lymphadenopathy.    RESP  Clear  P & A; w/o, wheezes/ rales/ or rhonchi. no accessory muscle use, no dullness to percussion  CARD:  RRR, no m/r/g, no peripheral edema, pulses intact, no cyanosis or clubbing.  GI:   Soft & nt; nml bowel sounds; no organomegaly or masses detected.   Musco: Warm bil, no deformities or joint swelling noted.   Neuro: alert, no focal deficits noted.    Skin: Warm, no lesions or rashes    Lab Results:  CBC   BNP No results found for: BNP  ProBNP No results found for: PROBNP  Imaging: DG Chest 1 View  Result Date: 07/31/2021 CLINICAL DATA:  Pneumothorax EXAM: CHEST  1 VIEW COMPARISON:  Portable exam 1321 hours compared to 1034 hours and correlated with CT chest of 07/25/2021 FINDINGS: Atherosclerotic calcification aorta. Normal heart size, mediastinal contours, and pulmonary vascularity. Emphysematous and bronchitic changes consistent with COPD. Markers in RIGHT upper lobe at 2 sites of nodularity seen on prior CT. Tiny RIGHT apex pneumothorax without mediastinal shift. Remaining lungs clear. No infiltrate or pleural effusion. Bones  demineralized with evidence of prior thoracolumbar spinal fixation. IMPRESSION: Tiny RIGHT apex pneumothorax. COPD changes with markers placed at 2 sites of nodularity identified in the RIGHT upper lobe on recent CT. Aortic Atherosclerosis (ICD10-I70.0) and Emphysema (ICD10-J43.9). Critical Value/emergent results were called by telephone at the time of interpretation on 07/31/2021 at 1:54 pm to provider Ina Homes MD, who verbally acknowledged these results. Electronically Signed   By: Lavonia Dana M.D.   On: 07/31/2021 13:54   DG Chest 2 View  Result Date: 08/02/2021 CLINICAL DATA:  Follow-up pneumothorax EXAM: CHEST - 2 VIEW COMPARISON:  08/01/2021 FINDINGS: Stable size and appearance of a small to moderate right apical pneumothorax. No abnormal shift of the heart or mediastinal structures. Nodular densities within the right lung with adjacent fiduciary clips. Lungs are hyperinflated. No pleural effusion. No left-sided pneumothorax. Thoracolumbar fixation hardware traversing severe L1 compression fracture. IMPRESSION: Stable size and appearance of a small to moderate right apical pneumothorax. Electronically Signed   By: Davina Poke D.O.   On: 08/02/2021 10:56   DG Chest 2 View  Result Date: 08/01/2021 CLINICAL DATA:  Pneumnothorax right EXAM: CHEST - 2 VIEW COMPARISON:  07/31/2021. FINDINGS: Increased size of a moderate right pneumothorax. No appreciable mediastinal shift. Cardiomediastinal silhouette is similar to prior. Mild nodular opacities in the right midlung in the region of 2 clips. Emphysema with hyperinflation. Partially imaged thoracolumbar spinal fusion hardware. Osteopenia. Similar height loss of a mid to lower thoracic vertebral body. IMPRESSION: 1. Increased size of the right pneumothorax, now moderate 2. Mild nodular opacities in the right midlung in the region of 2 clips, compatible with known pulmonary nodules and possibly superimposed post biopsy change. 3. Emphysema. These results  will be called to the ordering clinician or representative by the Radiologist Assistant, and communication documented in the PACS or [employer]. Electronically Signed   By: Margaretha Sheffield M.D.   On: 08/01/2021 10:07   NM PET Image Initial (PI) Skull Base To Thigh (F-18 FDG)  Result Date: 07/23/2021 CLINICAL DATA:  Initial treatment strategy for pulmonary nodules. EXAM: NUCLEAR MEDICINE PET SKULL BASE TO THIGH TECHNIQUE: 5.0 mCi  F-18 FDG was injected intravenously. Full-ring PET imaging was performed from the skull base to thigh after the radiotracer. CT data was obtained and used for attenuation correction and anatomic localization. Fasting blood glucose: 100 mg/dl COMPARISON:  Chest CT 02/14/2021 FINDINGS: Mediastinal blood pool activity: SUV max 2.2 Liver activity: SUV max NA NECK: No hypermetabolic lymph nodes in the neck. Incidental CT findings: 4.4 cm homogeneous well-defined lesion in the lower right neck/right thyroid is photopenic on PET imaging, compatible with a cyst. CHEST: 1.4 x 0.8 cm right upper lobe irregular solid nodule seen on previous chest CT is hypermetabolic SUV max = 3.4. The 2.3 x 1.5 cm irregular subsolid pulmonary nodule seen previously in the basilar right upper lobe also shows FDG accumulation with SUV max = 1.3. No hypermetabolic lymphadenopathy in the chest. No other suspicious hypermetabolic pulmonary nodule or mass. Incidental CT findings: Moderate atherosclerotic calcification is noted in the wall of the thoracic aorta. Centrilobular and paraseptal emphysema noted. ABDOMEN/PELVIS: No abnormal hypermetabolic activity within the liver, pancreas, adrenal glands, or spleen. No hypermetabolic lymph nodes in the abdomen or pelvis. Incidental CT findings: There is advanced atherosclerotic calcification of the abdominal aorta without aneurysm. Diverticular changes noted left colon without diverticulitis. Small left adrenal nodule has low density consistent with adenoma. No  hypermetabolism. SKELETON: No focal hypermetabolic activity to suggest skeletal metastasis. Incidental CT findings: Thoracolumbar fusion hardware evident. IMPRESSION: 1. 1.4 x 0.8 cm right upper lobe irregular solid nodule is hypermetabolic consistent with primary bronchogenic neoplasm. 2. 2.3 x 1.5 cm irregular sub solid pulmonary nodule in the basilar right lower lobe shows FDG accumulation. Given discernible FDG uptake and CT appearance, imaging features remain concerning for primary bronchogenic neoplasm (adenocarcinoma). 3. No evidence for hypermetabolic metastatic disease in the chest or elsewhere on today's study. 4. 4.4 cm cystic lesion in the right thyroid shows no FDG uptake. This has been evaluated on previous imaging. (ref: J Am Coll Radiol. 2015 Feb;12(2): 143-50). 5.  Aortic Atherosclerois (ICD10-170.0) 6.  Emphysema. ([id number]-D40.9) Electronically Signed   By: Misty Stanley M.D.   On: 07/23/2021 06:09   DG CHEST PORT 1 VIEW  Result Date: 07/31/2021 CLINICAL DATA:  Re-evaluate right-sided pneumothorax, status post EB U/S EXAM: PORTABLE CHEST 1 VIEW COMPARISON:  07/31/2021, 9:09 a.m. FINDINGS: Interval resolution of a previously seen small right pneumothorax. No appreciable residual pneumothorax. Marking clips present in the right upper lobe. The left lung is normally aerated. IMPRESSION: Interval resolution of a previously seen small right pneumothorax. Electronically Signed   By: Eddie Candle M.D.   On: 07/31/2021 11:02   DG CHEST PORT 1 VIEW  Result Date: 07/31/2021 CLINICAL DATA:  Post bronchoscopy with biopsy EXAM: PORTABLE CHEST 1 VIEW COMPARISON:  07/27/2014 FINDINGS: Patchy right mid lung opacity. Two clips are noted. A small to moderate pneumothorax is present. No pleural effusion. Cardiomediastinal contours are within normal limits. IMPRESSION: Small to moderate right pneumothorax post biopsy. Patchy right mid lung opacity with 2 clips probably reflects biopsy changes. These results will  be called to the ordering clinician or representative by the Radiologist Assistant, and communication documented in the PACS or [employer]. Electronically Signed   By: Macy Mis M.D.   On: 07/31/2021 09:38   CT Super D Chest Wo Contrast  Result Date: 07/26/2021 CLINICAL DATA:  Right lung nodules EXAM: CT CHEST WITHOUT CONTRAST TECHNIQUE: Multidetector CT imaging of the chest was performed using thin slice collimation for electromagnetic bronchoscopy planning purposes, without intravenous contrast. COMPARISON:  CT  chest 05/25/2021, PET-CT, 07/20/2021, thyroid ultrasound, 03/30/2021 FINDINGS: Cardiovascular: Aortic atherosclerosis. Normal heart size. No pericardial effusion. Mediastinum/Nodes: No enlarged mediastinal, hilar, or axillary lymph nodes. Unchanged large cystic right thyroid nodule, previously characterized by ultrasound. This has been evaluated on previous imaging. (ref: J Am Coll Radiol. 2015 Feb;12(2): 143-50). Trachea and esophagus demonstrate no significant findings. Lungs/Pleura: Moderate centrilobular emphysema. Redemonstrated spiculated nodule in the central right upper lobe is unchanged, measuring 1.3 by 0.8 cm (series 3, image 36). Redemonstrated spiculated, subsolid nodule of the inferior right upper lobe is unchanged, measuring 2.2 x 1.5 cm (series 3, image 71). No pleural effusion or pneumothorax. Upper Abdomen: No acute abnormality. Musculoskeletal: No chest wall mass or suspicious bone lesions identified. IMPRESSION: 1. Redemonstrated spiculated nodule in the central right upper lobe is unchanged in comparison to recent PET-CT, measuring 1.3 by 0.8 cm, previously FDG avid and concerning for adenocarcinoma. 2. Redemonstrated spiculated, subsolid nodule of the inferior right upper lobe is unchanged, measuring 2.2 x 1.5 cm, likewise previously FDG avid and concerning for adenocarcinoma. 3. Emphysema. Aortic Atherosclerosis (ICD10-I70.0) and Emphysema (ICD10-J43.9). Electronically  Signed   By: Eddie Candle M.D.   On: 07/26/2021 10:41   DG C-ARM BRONCHOSCOPY  Result Date: 07/31/2021 C-ARM BRONCHOSCOPY: Fluoroscopy was utilized by the requesting physician.  No radiographic interpretation.      No flowsheet data found.  No results found for: NITRICOXIDE      Assessment & Plan:   Lung cancer ([place]) 2 right upper lobe lung nodules hypermetabolic on PET underwent robotic assisted bronchoscopy with positive cytology for non-small cell carcinoma. Review CT scan and PET scan along with bronchoscopy cytology results consistent with underlying lung cancer.  PET scan with no evidence of distant disease. Will discuss case further with Dr. Valeta Harms however do not feel patient would be a significant surgical candidate as she has low BMI most likely significant emphysema.  Unable to do PFTs at this time due to her recent pneumothorax. For now we will refer to oncology and radiation oncology may just need SBRT going forward..  Plan  Patient Instructions  Chest xray today  Refer to Oncology and Radiation Oncology .  Flu shot today  Follow up with Dr. Valeta Harms or Monae Topping NP in 4 weeks and As needed        Malnutrition of moderate degree Patient with moderate protein calorie malnutrition.  Advised on a high-protein diet.  History of tobacco abuse Patient with significant smoking history.  Will need PFTs going forward.  Pneumothorax after biopsy Postprocedural pneumothorax.  Patient has no significant symptoms.  We will repeat chest x-ray today.  I have advised her on light activities only no heavy lifting or strenuous activities. Pending chest x-ray result we will decide on sooner follow-up for now can follow-up in 4 weeks and as needed  Plan  Patient Instructions  Chest xray today  Refer to Oncology and Radiation Oncology .  Flu shot today  Follow up with Dr. Valeta Harms or Nevin Kozuch NP in 4 weeks and As needed        I spent  40  minutes dedicated to the care of this  patient on the date of this encounter to include pre-visit review of records, face-to-face time with the patient discussing conditions above, post visit ordering of testing, clinical documentation with the electronic health record, making appropriate referrals as documented, and communicating necessary findings to members of the patients care team.    Rexene Edison, NP 08/10/2021

## 2021-08-10 NOTE — Assessment & Plan Note (Signed)
Patient with moderate protein calorie malnutrition.  Advised on a high-protein diet.

## 2021-08-10 NOTE — Assessment & Plan Note (Signed)
Patient with significant smoking history.  Will need PFTs going forward.

## 2021-08-13 ENCOUNTER — Encounter: Payer: Self-pay | Admitting: Internal Medicine

## 2021-08-13 ENCOUNTER — Inpatient Hospital Stay: Payer: Medicare Other

## 2021-08-13 ENCOUNTER — Inpatient Hospital Stay: Payer: Medicare Other | Attending: Internal Medicine | Admitting: Internal Medicine

## 2021-08-13 ENCOUNTER — Other Ambulatory Visit: Payer: Self-pay

## 2021-08-13 VITALS — BP 172/77 | HR 78 | Temp 96.2°F | Resp 18 | Ht 62.0 in | Wt 95.5 lb

## 2021-08-13 DIAGNOSIS — C3411 Malignant neoplasm of upper lobe, right bronchus or lung: Secondary | ICD-10-CM

## 2021-08-13 DIAGNOSIS — C3490 Malignant neoplasm of unspecified part of unspecified bronchus or lung: Secondary | ICD-10-CM | POA: Diagnosis not present

## 2021-08-13 LAB — CBC WITH DIFFERENTIAL (CANCER CENTER ONLY)
Abs Immature Granulocytes: 0.02 10*3/uL (ref 0.00–0.07)
Basophils Absolute: 0 10*3/uL (ref 0.0–0.1)
Basophils Relative: 0 %
Eosinophils Absolute: 0.1 10*3/uL (ref 0.0–0.5)
Eosinophils Relative: 1 %
HCT: 37.8 % (ref 36.0–46.0)
Hemoglobin: 12.7 g/dL (ref 12.0–15.0)
Immature Granulocytes: 0 %
Lymphocytes Relative: 30 %
Lymphs Abs: 1.8 10*3/uL (ref 0.7–4.0)
MCH: 31.6 pg (ref 26.0–34.0)
MCHC: 33.6 g/dL (ref 30.0–36.0)
MCV: 94 fL (ref 80.0–100.0)
Monocytes Absolute: 0.5 10*3/uL (ref 0.1–1.0)
Monocytes Relative: 8 %
Neutro Abs: 3.6 10*3/uL (ref 1.7–7.7)
Neutrophils Relative %: 61 %
Platelet Count: 235 10*3/uL (ref 150–400)
RBC: 4.02 MIL/uL (ref 3.87–5.11)
RDW: 12.9 % (ref 11.5–15.5)
WBC Count: 5.9 10*3/uL (ref 4.0–10.5)
nRBC: 0 % (ref 0.0–0.2)

## 2021-08-13 LAB — CMP (CANCER CENTER ONLY)
ALT: 11 U/L (ref 0–44)
AST: 18 U/L (ref 15–41)
Albumin: 4.2 g/dL (ref 3.5–5.0)
Alkaline Phosphatase: 60 U/L (ref 38–126)
Anion gap: 11 (ref 5–15)
BUN: 8 mg/dL (ref 8–23)
CO2: 24 mmol/L (ref 22–32)
Calcium: 9.4 mg/dL (ref 8.9–10.3)
Chloride: 100 mmol/L (ref 98–111)
Creatinine: 0.73 mg/dL (ref 0.44–1.00)
GFR, Estimated: >60 mL/min (ref >60)
Glucose, Bld: 95 mg/dL (ref 70–99)
Potassium: 4.1 mmol/L (ref 3.5–5.1)
Sodium: 135 mmol/L (ref 135–145)
Total Bilirubin: 0.6 mg/dL (ref 0.3–1.2)
Total Protein: 7.6 g/dL (ref 6.5–8.1)

## 2021-08-13 NOTE — Progress Notes (Signed)
Called and spoke with patient, advised of results/recommendations per Tammy Parrett NP.  She verbalized understanding.  Nothing further needed.

## 2021-08-13 NOTE — Progress Notes (Signed)
[place] [phone]   [phone]  CONSULT NOTE  REFERRING PHYSICIAN: Dr. Leory Plowman Icard  REASON FOR CONSULTATION:  75 years old white female recently diagnosed with lung cancer.  HPI CORNELLA EMMER is a 75 y.o. female with past medical history significant for osteoarthritis, COPD, osteoporosis, hypertension, back surgery as well as long history of smoking.  The patient mentioned that she had CT screening performed at by her primary care physician in [facility] and that showed suspicious nodule in her right lung.  She was referred to Dr. Valeta Harms and on July 23, 2021 she had a PET scan performed and that showed 1.4 x 0.8 cm right upper lobe irregular solid nodule with SUV max of 3.4.  The 2.3 x 1.5 cm irregular soft solid pulmonary nodule seen on the previous scan in the basilar right upper lobe showed FDG accumulation with SUV max of 1.3.  There was no evidence for hypermetabolic metastatic disease in the chest or elsewhere.  On July 31, 2021 the patient underwent video bronchoscopy with robotic assisted bronchoscopic navigation under the care of Dr. Valeta Harms.  The final pathology ([id number]) showed the fine-needle aspiration of the target 1 right upper lobe nodule as well as the brushing had malignant cells consistent with non-small cell carcinoma.  Brushing and fine-needle aspiration of the second nodule showed rare atypical cells.  Immunohistochemistry shows there is focal, weak positivity  with p63.  TTF-1 and cytokeratin 5/6 are negative.  The immunophenotype is nonspecific.  The patient was referred to me today for evaluation and recommendation regarding her condition. When seen today she is feeling fine with no concerning complaints.  She denied having any current chest pain, shortness of breath, cough or hemoptysis.  She denied having any nausea, vomiting, diarrhea or constipation.  She has no headache or visual changes.  She has  no significant weight loss or night sweats.  Her son mentions that she had some slurred speech recently. Family history significant for father who died from old age.  Mother had Alzheimer and brother had throat cancer. The patient is single and has 1 son, Rush Landmark who was available by phone during the visit.  She was accompanied by her daughter-in-law Tye [place].  She used to work for [employer].  She has a history for smoking 1 pack/day for around 60 years and quit May 11, 2021.  She also drink 2 to 3 glasses of wine on daily basis.  She has no history of drug abuse.  HPI  Past Medical History:  Diagnosis Date   Arthritis    Complication of anesthesia    COPD (chronic obstructive pulmonary disease) ([place])    History of blood transfusion    with back surgery procedure   Hypercholesteremia    Memory loss    Osteoporosis    Shortness of breath    with exertion, no oxygen   Smoker    quit 05/14/21  - 1ppd - pt started smoking at the age 51yrs old    Past Surgical History:  Procedure Laterality Date   ABDOMINAL HYSTERECTOMY     APPENDECTOMY     BRONCHIAL BIOPSY  07/31/2021   Procedure: BRONCHIAL BIOPSIES;  Surgeon: Garner Nash, DO;  Location: [facility];  Service: Pulmonary;;   BRONCHIAL BRUSHINGS  07/31/2021   Procedure: BRONCHIAL BRUSHINGS;  Surgeon: Garner Nash, DO;  Location: [place] [facility];  Service: Pulmonary;;   BRONCHIAL NEEDLE ASPIRATION BIOPSY  07/31/2021   Procedure: BRONCHIAL NEEDLE ASPIRATION  BIOPSIES;  Surgeon: Garner Nash, DO;  Location: [place] [facility];  Service: Pulmonary;;   BRONCHIAL WASHINGS  07/31/2021   Procedure: BRONCHIAL WASHINGS;  Surgeon: Garner Nash, DO;  Location: [facility];  Service: Pulmonary;;   COLONOSCOPY W/ POLYPECTOMY     EYE SURGERY     bilateral cataract removal   FIDUCIAL MARKER PLACEMENT  07/31/2021   Procedure: FIDUCIAL MARKER PLACEMENT;  Surgeon: Garner Nash, DO;  Location: [facility];  Service: Pulmonary;;   POSTERIOR LUMBAR FUSION 4  LEVEL N/A 08/03/2014   Procedure: THORACIC TEN-ELEVEN,THORACIC ELEVEN-TWELVE,THORACIC TWELVE-LUMBAR ONE,LUMBARTWO-THREE,LUMBAR THREE-FOUR POSTERIOR LATERAL FUSION;  Surgeon: Elaina Hoops, MD;  Location: [place];  Service: Neurosurgery;  Laterality: N/A;  Site includes thoracic and lumbar spine   VIDEO BRONCHOSCOPY WITH ENDOBRONCHIAL NAVIGATION N/A 07/31/2021   Procedure: VIDEO BRONCHOSCOPY WITH ENDOBRONCHIAL NAVIGATION;  Surgeon: Garner Nash, DO;  Location: [facility];  Service: Pulmonary;  Laterality: N/A;  ION   VIDEO BRONCHOSCOPY WITH RADIAL ENDOBRONCHIAL ULTRASOUND  07/31/2021   Procedure: RADIAL ENDOBRONCHIAL ULTRASOUND;  Surgeon: Garner Nash, DO;  Location: [place] [facility];  Service: Pulmonary;;    No family history on file.  Social History Social History   Tobacco Use   Smoking status: Former    Packs/day: 0.50    Years: 48.00    Pack years: 24.00    Types: Cigarettes    Quit date: 06/03/2021    Years since quitting: 0.1   Smokeless tobacco: Never  Vaping Use   Vaping Use: Never used  Substance Use Topics   Alcohol use: Yes    Comment: drinks wine daily & on weekends   Drug use: No    Allergies  Allergen Reactions   Latex     Band-aids, pull off skin    Current Outpatient Medications  Medication Sig Dispense Refill   brimonidine (ALPHAGAN) 0.2 % ophthalmic solution Place 1 drop into both eyes 2 (two) times daily.     CALCIUM-VITAMIN D PO Take 2 tablets by mouth daily.     denosumab (PROLIA) 60 MG/ML SOSY injection Inject 60 mg into the skin every 6 (six) months.     dorzolamide (TRUSOPT) 2 % ophthalmic solution Place 1 drop into both eyes 2 (two) times daily.     No current facility-administered medications for this visit.    Review of Systems  Constitutional: positive for fatigue Eyes: negative Ears, nose, mouth, throat, and face: negative Respiratory: negative Cardiovascular: negative Gastrointestinal:  negative Genitourinary:negative Integument/breast: negative Hematologic/lymphatic: negative Musculoskeletal:negative Neurological: positive for speech problems Behavioral/Psych: negative Endocrine: negative Allergic/Immunologic: negative  Physical Exam  HOZ:YYQMG, healthy, no distress, well nourished, and well developed SKIN: skin color, texture, turgor are normal, no rashes or significant lesions HEAD: Normocephalic, No masses, lesions, tenderness or abnormalities EYES: normal, PERRLA, Conjunctiva are pink and non-injected EARS: External ears normal, Canals clear OROPHARYNX:no exudate, no erythema, and lips, buccal mucosa, and tongue normal  NECK: supple, no adenopathy, no JVD LYMPH:  no palpable lymphadenopathy, no hepatosplenomegaly BREAST:not examined LUNGS: clear to auscultation , and palpation HEART: regular rate & rhythm, no murmurs, and no gallops ABDOMEN:abdomen soft, non-tender, and normal bowel sounds BACK: Back symmetric, no curvature., No CVA tenderness, Range of motion is normal EXTREMITIES:no joint deformities, effusion, or inflammation, no edema  NEURO: alert & oriented x 3 with fluent speech, no focal motor/sensory deficits  PERFORMANCE STATUS: ECOG 1  LABORATORY DATA: Lab Results  Component Value Date   WBC 5.9 08/13/2021   HGB 12.7 08/13/2021  HCT 37.8 08/13/2021   MCV 94.0 08/13/2021   PLT 235 08/13/2021      Chemistry      Component Value Date/Time   NA 135 08/13/2021 1401   K 4.1 08/13/2021 1401   CL 100 08/13/2021 1401   CO2 24 08/13/2021 1401   BUN 8 08/13/2021 1401   CREATININE 0.73 08/13/2021 1401      Component Value Date/Time   CALCIUM 9.4 08/13/2021 1401   ALKPHOS 60 08/13/2021 1401   AST 18 08/13/2021 1401   ALT 11 08/13/2021 1401   BILITOT 0.6 08/13/2021 1401       RADIOGRAPHIC STUDIES: DG Chest 1 View  Result Date: 07/31/2021 CLINICAL DATA:  Pneumothorax EXAM: CHEST  1 VIEW COMPARISON:  Portable exam 1321 hours compared  to 1034 hours and correlated with CT chest of 07/25/2021 FINDINGS: Atherosclerotic calcification aorta. Normal heart size, mediastinal contours, and pulmonary vascularity. Emphysematous and bronchitic changes consistent with COPD. Markers in RIGHT upper lobe at 2 sites of nodularity seen on prior CT. Tiny RIGHT apex pneumothorax without mediastinal shift. Remaining lungs clear. No infiltrate or pleural effusion. Bones demineralized with evidence of prior thoracolumbar spinal fixation. IMPRESSION: Tiny RIGHT apex pneumothorax. COPD changes with markers placed at 2 sites of nodularity identified in the RIGHT upper lobe on recent CT. Aortic Atherosclerosis (ICD10-I70.0) and Emphysema (ICD10-J43.9). Critical Value/emergent results were called by telephone at the time of interpretation on 07/31/2021 at 1:54 pm to provider Ina Homes MD, who verbally acknowledged these results. Electronically Signed   By: Lavonia Dana M.D.   On: 07/31/2021 13:54   DG Chest 2 View  Result Date: 08/11/2021 CLINICAL DATA:  Shortness of breath. Follow-up pneumothorax after biopsy. EXAM: CHEST - 2 VIEW COMPARISON:  Chest x-ray 08/02/2021. FINDINGS: There is no evidence for pneumothorax. The lungs are clear. The cardiomediastinal silhouette is within normal limits. There is no pleural effusion. Punctate radiopaque markers overlie the right chest, unchanged. Thoracolumbar fusion hardware is partially visualized. IMPRESSION: No active cardiopulmonary disease. Electronically Signed   By: Ronney Asters M.D.   On: 08/11/2021 23:10   DG Chest 2 View  Result Date: 08/02/2021 CLINICAL DATA:  Follow-up pneumothorax EXAM: CHEST - 2 VIEW COMPARISON:  08/01/2021 FINDINGS: Stable size and appearance of a small to moderate right apical pneumothorax. No abnormal shift of the heart or mediastinal structures. Nodular densities within the right lung with adjacent fiduciary clips. Lungs are hyperinflated. No pleural effusion. No left-sided pneumothorax.  Thoracolumbar fixation hardware traversing severe L1 compression fracture. IMPRESSION: Stable size and appearance of a small to moderate right apical pneumothorax. Electronically Signed   By: Davina Poke D.O.   On: 08/02/2021 10:56   DG Chest 2 View  Result Date: 08/01/2021 CLINICAL DATA:  Pneumnothorax right EXAM: CHEST - 2 VIEW COMPARISON:  07/31/2021. FINDINGS: Increased size of a moderate right pneumothorax. No appreciable mediastinal shift. Cardiomediastinal silhouette is similar to prior. Mild nodular opacities in the right midlung in the region of 2 clips. Emphysema with hyperinflation. Partially imaged thoracolumbar spinal fusion hardware. Osteopenia. Similar height loss of a mid to lower thoracic vertebral body. IMPRESSION: 1. Increased size of the right pneumothorax, now moderate 2. Mild nodular opacities in the right midlung in the region of 2 clips, compatible with known pulmonary nodules and possibly superimposed post biopsy change. 3. Emphysema. These results will be called to the ordering clinician or representative by the Radiologist Assistant, and communication documented in the PACS or [employer]. Electronically Signed   By: Albertina Parr  Adah Salvage M.D.   On: 08/01/2021 10:07   NM PET Image Initial (PI) Skull Base To Thigh (F-18 FDG)  Result Date: 07/23/2021 CLINICAL DATA:  Initial treatment strategy for pulmonary nodules. EXAM: NUCLEAR MEDICINE PET SKULL BASE TO THIGH TECHNIQUE: 5.0 mCi F-18 FDG was injected intravenously. Full-ring PET imaging was performed from the skull base to thigh after the radiotracer. CT data was obtained and used for attenuation correction and anatomic localization. Fasting blood glucose: 100 mg/dl COMPARISON:  Chest CT 02/14/2021 FINDINGS: Mediastinal blood pool activity: SUV max 2.2 Liver activity: SUV max NA NECK: No hypermetabolic lymph nodes in the neck. Incidental CT findings: 4.4 cm homogeneous well-defined lesion in the lower right neck/right thyroid  is photopenic on PET imaging, compatible with a cyst. CHEST: 1.4 x 0.8 cm right upper lobe irregular solid nodule seen on previous chest CT is hypermetabolic SUV max = 3.4. The 2.3 x 1.5 cm irregular subsolid pulmonary nodule seen previously in the basilar right upper lobe also shows FDG accumulation with SUV max = 1.3. No hypermetabolic lymphadenopathy in the chest. No other suspicious hypermetabolic pulmonary nodule or mass. Incidental CT findings: Moderate atherosclerotic calcification is noted in the wall of the thoracic aorta. Centrilobular and paraseptal emphysema noted. ABDOMEN/PELVIS: No abnormal hypermetabolic activity within the liver, pancreas, adrenal glands, or spleen. No hypermetabolic lymph nodes in the abdomen or pelvis. Incidental CT findings: There is advanced atherosclerotic calcification of the abdominal aorta without aneurysm. Diverticular changes noted left colon without diverticulitis. Small left adrenal nodule has low density consistent with adenoma. No hypermetabolism. SKELETON: No focal hypermetabolic activity to suggest skeletal metastasis. Incidental CT findings: Thoracolumbar fusion hardware evident. IMPRESSION: 1. 1.4 x 0.8 cm right upper lobe irregular solid nodule is hypermetabolic consistent with primary bronchogenic neoplasm. 2. 2.3 x 1.5 cm irregular sub solid pulmonary nodule in the basilar right lower lobe shows FDG accumulation. Given discernible FDG uptake and CT appearance, imaging features remain concerning for primary bronchogenic neoplasm (adenocarcinoma). 3. No evidence for hypermetabolic metastatic disease in the chest or elsewhere on today's study. 4. 4.4 cm cystic lesion in the right thyroid shows no FDG uptake. This has been evaluated on previous imaging. (ref: J Am Coll Radiol. 2015 Feb;12(2): 143-50). 5.  Aortic Atherosclerois (ICD10-170.0) 6.  Emphysema. (UKG25-K27.9) Electronically Signed   By: Misty Stanley M.D.   On: 07/23/2021 06:09   DG CHEST PORT 1  VIEW  Result Date: 07/31/2021 CLINICAL DATA:  Re-evaluate right-sided pneumothorax, status post EB U/S EXAM: PORTABLE CHEST 1 VIEW COMPARISON:  07/31/2021, 9:09 a.m. FINDINGS: Interval resolution of a previously seen small right pneumothorax. No appreciable residual pneumothorax. Marking clips present in the right upper lobe. The left lung is normally aerated. IMPRESSION: Interval resolution of a previously seen small right pneumothorax. Electronically Signed   By: Eddie Candle M.D.   On: 07/31/2021 11:02   DG CHEST PORT 1 VIEW  Result Date: 07/31/2021 CLINICAL DATA:  Post bronchoscopy with biopsy EXAM: PORTABLE CHEST 1 VIEW COMPARISON:  07/27/2014 FINDINGS: Patchy right mid lung opacity. Two clips are noted. A small to moderate pneumothorax is present. No pleural effusion. Cardiomediastinal contours are within normal limits. IMPRESSION: Small to moderate right pneumothorax post biopsy. Patchy right mid lung opacity with 2 clips probably reflects biopsy changes. These results will be called to the ordering clinician or representative by the Radiologist Assistant, and communication documented in the PACS or [employer]. Electronically Signed   By: Macy Mis M.D.   On: 07/31/2021 09:38  CT Super D Chest Wo Contrast  Result Date: 07/26/2021 CLINICAL DATA:  Right lung nodules EXAM: CT CHEST WITHOUT CONTRAST TECHNIQUE: Multidetector CT imaging of the chest was performed using thin slice collimation for electromagnetic bronchoscopy planning purposes, without intravenous contrast. COMPARISON:  CT chest 05/25/2021, PET-CT, 07/20/2021, thyroid ultrasound, 03/30/2021 FINDINGS: Cardiovascular: Aortic atherosclerosis. Normal heart size. No pericardial effusion. Mediastinum/Nodes: No enlarged mediastinal, hilar, or axillary lymph nodes. Unchanged large cystic right thyroid nodule, previously characterized by ultrasound. This has been evaluated on previous imaging. (ref: J Am Coll Radiol. 2015 Feb;12(2):  143-50). Trachea and esophagus demonstrate no significant findings. Lungs/Pleura: Moderate centrilobular emphysema. Redemonstrated spiculated nodule in the central right upper lobe is unchanged, measuring 1.3 by 0.8 cm (series 3, image 36). Redemonstrated spiculated, subsolid nodule of the inferior right upper lobe is unchanged, measuring 2.2 x 1.5 cm (series 3, image 71). No pleural effusion or pneumothorax. Upper Abdomen: No acute abnormality. Musculoskeletal: No chest wall mass or suspicious bone lesions identified. IMPRESSION: 1. Redemonstrated spiculated nodule in the central right upper lobe is unchanged in comparison to recent PET-CT, measuring 1.3 by 0.8 cm, previously FDG avid and concerning for adenocarcinoma. 2. Redemonstrated spiculated, subsolid nodule of the inferior right upper lobe is unchanged, measuring 2.2 x 1.5 cm, likewise previously FDG avid and concerning for adenocarcinoma. 3. Emphysema. Aortic Atherosclerosis (ICD10-I70.0) and Emphysema (ICD10-J43.9). Electronically Signed   By: Eddie Candle M.D.   On: 07/26/2021 10:41   DG C-ARM BRONCHOSCOPY  Result Date: 07/31/2021 C-ARM BRONCHOSCOPY: Fluoroscopy was utilized by the requesting physician.  No radiographic interpretation.    ASSESSMENT: This is a very pleasant 75 years old white female recently diagnosed with stage Ia (T1b, N0, M0) non-small cell lung cancer presented with right upper lobe lung nodule that biopsy confirmed in September 2022.  There was also a groundglass area more inferior to the first 1 also suspicious for a stage Ia (T1c, N0, M0) non-small cell lung cancer and the biopsy showed atypical cells.   PLAN: I had a lengthy discussion with the patient and her family today about her current disease stage, prognosis and treatment options.  I personally and independently reviewed the scan images and discussed the result and showed the images to the patient today. The patient has a lot of comorbidities and a long smoking  history and I am not sure she will be a good candidate for surgical resection. I will complete the staging work-up by ordering MRI of the brain to rule out brain metastasis especially with her speech problem as mentioned by the son. I recommended for the patient to see radiation oncology for consideration of SBRT to the 2 right upper lobe pulmonary nodules with a curative intent. I will arrange for the patient to come back for follow-up visit in around 4 months for evaluation and repeat CT scan of the chest for restaging of her disease after the treatment. For the smoking cessation, I strongly encouraged the patient to continue with the smoking cessation. She was advised to call immediately if she has any other concerning symptoms in the interval.  The patient voices understanding of current disease status and treatment options and is in agreement with the current care plan.  All questions were answered. The patient knows to call the clinic with any problems, questions or concerns. We can certainly see the patient much sooner if necessary.  Thank you so much for allowing me to participate in the care of [employer]. I will continue to follow up  the patient with you and assist in her care.  The total time spent in the appointment was 60 minutes.  Disclaimer: This note was dictated with voice recognition software. Similar sounding words can inadvertently be transcribed and may not be corrected upon review.   Eilleen Kempf August 13, 2021, 2:34 PM

## 2021-08-14 ENCOUNTER — Telehealth: Payer: Self-pay | Admitting: *Deleted

## 2021-08-14 ENCOUNTER — Telehealth: Payer: Self-pay | Admitting: Internal Medicine

## 2021-08-14 NOTE — Telephone Encounter (Signed)
Scheduled appt per 10/3 los - patient is aware of appt date and time

## 2021-08-14 NOTE — Progress Notes (Addendum)
Location of tumor and Histology per Pathology Report: RUL lung NSCLC  Biopsy: 07/31/2021   Past/Anticipated interventions by surgeon, if any:   Surgeon: Garner Nash, DO Operation: Flexible video fiberoptic bronchoscopy with robotic assistance and biopsies.  Past/Anticipated interventions by medical oncology, if any: Dr Julien Nordmann Chemotherapy - not at this time I recommended for the patient to see radiation oncology for consideration of SBRT to the 2 right upper lobe pulmonary nodules with a curative intent.   Pain issues, if any:  no   SAFETY ISSUES: Prior radiation? no Pacemaker/ICD? no Possible current pregnancy? no Is the patient on methotrexate? no  Current Complaints / other details:  changes in speech. Speech is slowed. Cough without hemoptysis    Vitals:   08/20/21 1340  BP: (!) 167/60  Pulse: 67  Resp: 18  Temp: (!) 97.5 F (36.4 C)  SpO2: 99%  Weight: 94 lb 9.6 oz (42.9 kg)  Height: 5\' 2"  (1.575 m)

## 2021-08-14 NOTE — Telephone Encounter (Signed)
I followed up on Dr. Worthy Flank note on Ms. Virginia Rose from  yesterday.  He would like her to get an MRI brain. I called central scheduling and was given an appt. I called Ms. Virginia Rose with an update on her appt and clarified questions.

## 2021-08-15 DIAGNOSIS — C3490 Malignant neoplasm of unspecified part of unspecified bronchus or lung: Secondary | ICD-10-CM | POA: Diagnosis not present

## 2021-08-15 DIAGNOSIS — Z0000 Encounter for general adult medical examination without abnormal findings: Secondary | ICD-10-CM | POA: Diagnosis not present

## 2021-08-15 DIAGNOSIS — J449 Chronic obstructive pulmonary disease, unspecified: Secondary | ICD-10-CM | POA: Diagnosis not present

## 2021-08-15 DIAGNOSIS — E785 Hyperlipidemia, unspecified: Secondary | ICD-10-CM | POA: Diagnosis not present

## 2021-08-18 ENCOUNTER — Ambulatory Visit (HOSPITAL_COMMUNITY)
Admission: RE | Admit: 2021-08-18 | Discharge: 2021-08-18 | Disposition: A | Discharge status: Home / Self Care | Payer: Medicare Other | Source: Ambulatory Visit | Attending: Internal Medicine | Admitting: Internal Medicine

## 2021-08-18 DIAGNOSIS — C3490 Malignant neoplasm of unspecified part of unspecified bronchus or lung: Secondary | ICD-10-CM | POA: Insufficient documentation

## 2021-08-18 DIAGNOSIS — I6782 Cerebral ischemia: Secondary | ICD-10-CM | POA: Diagnosis not present

## 2021-08-18 MED ORDER — GADOBUTROL 1 MMOL/ML IV SOLN
4.0000 mL | Freq: Once | INTRAVENOUS | Status: AC | PRN
  Administered 2021-08-18: 4 mL via INTRAVENOUS

## 2021-08-18 NOTE — Progress Notes (Signed)
Radiation Oncology         [phone] ________________________________  Initial Outpatient Consultation  Name: Virginia Rose MRN: [id number]  Date: 08/20/2021  DOB: 20-Oct-1946  NI:[id number], [facility] @ [address], [place], [place]   REFERRING PHYSICIAN: Garner Nash, DO  DIAGNOSIS: The encounter diagnosis was Malignant neoplasm of upper lobe of right lung ([place]).  non-small cell carcinoma of the RUL, PET positive nodule in the right inferior upper lobe  HISTORY OF PRESENT ILLNESS::Virginia Rose is a 75 y.o. female former smoker who is accompanied by daughter-in-law. she is seen as a courtesy of Dr. Julien Nordmann for an opinion concerning radiation therapy as part of management for her recently diagnosed non-small cell carcinoma of the RUL. The patient was enrolled in the lung cancer screening program and presented for a lung cancer screening CT which demonstrated  2 right-sided nodules. Follow-up CT showed persistence of the 2 nodules (dates unknown).   Subsequently, the patient was referred to Dr. Valeta Harms on 06/29/21 for further evaluation and planning. Following discussion, the patient agreed to undergo robotic assisted bronchoscopy with tissue biopsies.   PET scan performed on 07/20/21 demonstrated the 1.4 x 0.8 cm right upper lobe irregular solid nodule as hypermetabolic and consistent with primary bronchogenic neoplasm, as well as a 2.3 x 1.5 cm irregular sub solid pulmonary nodule in the basilar right lower lobe showing FDG accumulation; noted as concerning for primary bronchogenic neoplasm. No evidence for hypermetabolic metastatic disease in the chest or elsewhere was seen. (Of note: a 4.4 cm cystic lesion in the right thyroid was also seen and noted to show no FDG uptake).  Chest CT on 07/25/21 redemonstrated the spiculated nodule in the central right upper lobe as unchanged in comparison to recent PET-CT, measuring 1.3 by 0.8 cm and remaining concerning for  adenocarcinoma. CT also redemonstrated the subsolid nodule of the inferior right upper lobe as unchanged, measuring 2.2 x 1.5 cm and concerning for adenocarcinoma.  Right upper lobe biopsies performed on 07/31/21 revealed malignant cells consistent with non-small cell carcinoma.  Unfortunately, the patient suffered a postprocedural pneumothorax which was drained following the procedure on 09/20 in the PACU. The patient was asymptomatic with stable O2 sats and was discharged home for follow-up on 08/01/21 with repeat imaging. Chest x-ray on 08/01/21 revealed some expansion of the pneumothorax though noted as small in size. Chest x-ray on 08/02/21 demonstrated the moderate right apical pneumothorax as stable in appearance and size.  The patient was referred to Dr. Julien Nordmann on 08/12/21 for evaluation and recommendation regarding her condition. Dr Julien Nordmann recommended for the patient to see radiation oncology for consideration of SBRT to the 2 right upper lobe pulmonary nodules with a curative intent.    PREVIOUS RADIATION THERAPY: No  PAST MEDICAL HISTORY:  Past Medical History:  Diagnosis Date   Arthritis    Complication of anesthesia    COPD (chronic obstructive pulmonary disease) ([place])    History of blood transfusion    with back surgery procedure   Hypercholesteremia    Memory loss    Osteoporosis    Shortness of breath    with exertion, no oxygen   Smoker    quit 05/14/21  - 1ppd - pt started smoking at the age 60yrs old    PAST SURGICAL HISTORY: Past Surgical History:  Procedure Laterality Date   ABDOMINAL HYSTERECTOMY     APPENDECTOMY     BRONCHIAL BIOPSY  07/31/2021   Procedure: BRONCHIAL BIOPSIES;  Surgeon:  Garner Nash, DO;  Location: [place];  Service: Pulmonary;;   BRONCHIAL BRUSHINGS  07/31/2021   Procedure: BRONCHIAL BRUSHINGS;  Surgeon: Garner Nash, DO;  Location: [place] [facility];  Service: Pulmonary;;   BRONCHIAL NEEDLE ASPIRATION BIOPSY  07/31/2021    Procedure: BRONCHIAL NEEDLE ASPIRATION BIOPSIES;  Surgeon: Garner Nash, DO;  Location: [place];  Service: Pulmonary;;   BRONCHIAL WASHINGS  07/31/2021   Procedure: BRONCHIAL WASHINGS;  Surgeon: Garner Nash, DO;  Location: [facility];  Service: Pulmonary;;   COLONOSCOPY W/ POLYPECTOMY     EYE SURGERY     bilateral cataract removal   FIDUCIAL MARKER PLACEMENT  07/31/2021   Procedure: FIDUCIAL MARKER PLACEMENT;  Surgeon: Garner Nash, DO;  Location: [place] [facility];  Service: Pulmonary;;   POSTERIOR LUMBAR FUSION 4 LEVEL N/A 08/03/2014   Procedure: THORACIC TEN-ELEVEN,THORACIC ELEVEN-TWELVE,THORACIC TWELVE-LUMBAR ONE,LUMBARTWO-THREE,LUMBAR THREE-FOUR POSTERIOR LATERAL FUSION;  Surgeon: Elaina Hoops, MD;  Location: [facility];  Service: Neurosurgery;  Laterality: N/A;  Site includes thoracic and lumbar spine   VIDEO BRONCHOSCOPY WITH ENDOBRONCHIAL NAVIGATION N/A 07/31/2021   Procedure: VIDEO BRONCHOSCOPY WITH ENDOBRONCHIAL NAVIGATION;  Surgeon: Garner Nash, DO;  Location: [place];  Service: Pulmonary;  Laterality: N/A;  ION   VIDEO BRONCHOSCOPY WITH RADIAL ENDOBRONCHIAL ULTRASOUND  07/31/2021   Procedure: RADIAL ENDOBRONCHIAL ULTRASOUND;  Surgeon: Garner Nash, DO;  Location: [facility];  Service: Pulmonary;;    FAMILY HISTORY: History reviewed. No pertinent family history.  SOCIAL HISTORY:  Social History   Tobacco Use   Smoking status: Former    Packs/day: 0.50    Years: 48.00    Pack years: 24.00    Types: Cigarettes    Quit date: 06/03/2021    Years since quitting: 0.2   Smokeless tobacco: Never  Vaping Use   Vaping Use: Never used  Substance Use Topics   Alcohol use: Yes    Comment: drinks wine daily & on weekends   Drug use: No    ALLERGIES:  Allergies  Allergen Reactions   Latex     Band-aids, pull off skin    MEDICATIONS:  Current Outpatient Medications  Medication Sig Dispense Refill   brimonidine (ALPHAGAN) 0.2 % ophthalmic solution  Place 1 drop into both eyes 2 (two) times daily.     CALCIUM-VITAMIN D PO Take 2 tablets by mouth daily.     denosumab (PROLIA) 60 MG/ML SOSY injection Inject 60 mg into the skin every 6 (six) months.     dorzolamide (TRUSOPT) 2 % ophthalmic solution Place 1 drop into both eyes 2 (two) times daily.     No current facility-administered medications for this encounter.    REVIEW OF SYSTEMS:  A 10+ POINT REVIEW OF SYSTEMS WAS OBTAINED including neurology, dermatology, psychiatry, cardiac, respiratory, lymph, extremities, GI, GU, musculoskeletal, constitutional, reproductive, HEENT.  Patient has recently developed problems with expressive aphasia and underwent MRI of the brain over the weekend with results pending at this time.  She denies any headaches or nausea symptoms.  She denies any significant pain within the chest area significant cough or hemoptysis.   PHYSICAL EXAM:  height is 5\' 2"  (1.575 m) and weight is 94 lb 9.6 oz (42.9 kg). Her temperature is 97.5 F (36.4 C) (abnormal). Her blood pressure is 167/60 (abnormal) and her pulse is 67. Her respiration is 18 and oxygen saturation is 99%.   General: Alert and oriented, in no acute distress, she has obvious difficulty finding words with delayed response to her speech.  HEENT: Head is normocephalic. Extraocular movements are intact.  Neck: Neck is supple, no palpable cervical or supraclavicular lymphadenopathy. Heart: Regular in rate and rhythm with no murmurs, rubs, or gallops. Chest: Clear to auscultation bilaterally, with no rhonchi, wheezes, or rales. Abdomen: Soft, nontender, nondistended, with no rigidity or guarding. Extremities: No cyanosis or edema. Lymphatics: see Neck Exam Skin: No concerning lesions. Musculoskeletal: symmetric strength and muscle tone throughout. Neurologic: Cranial nerves II through XII are grossly intact. No obvious focalities. Speech is fluent. Coordination is intact. Psychiatric: Judgment and insight are  intact. Affect is appropriate.   ECOG = 1  0 - Asymptomatic (Fully active, able to carry on all predisease activities without restriction)  1 - Symptomatic but completely ambulatory (Restricted in physically strenuous activity but ambulatory and able to carry out work of a light or sedentary nature. For example, light housework, office work)  2 - Symptomatic, <50% in bed during the day (Ambulatory and capable of all self care but unable to carry out any work activities. Up and about more than 50% of waking hours)  3 - Symptomatic, >50% in bed, but not bedbound (Capable of only limited self-care, confined to bed or chair 50% or more of waking hours)  4 - Bedbound (Completely disabled. Cannot carry on any self-care. Totally confined to bed or chair)  5 - Death   Eustace Pen MM, Creech RH, Tormey DC, et al. [phone]). "Toxicity and response criteria of the [facility]". [place] Oncol. 5 (6): 649-55  LABORATORY DATA:  Lab Results  Component Value Date   WBC 5.9 08/13/2021   HGB 12.7 08/13/2021   HCT 37.8 08/13/2021   MCV 94.0 08/13/2021   PLT 235 08/13/2021   NEUTROABS 3.6 08/13/2021   Lab Results  Component Value Date   NA 135 08/13/2021   K 4.1 08/13/2021   CL 100 08/13/2021   CO2 24 08/13/2021   GLUCOSE 95 08/13/2021   CREATININE 0.73 08/13/2021   CALCIUM 9.4 08/13/2021      RADIOGRAPHY: DG Chest 1 View  Result Date: 07/31/2021 CLINICAL DATA:  Pneumothorax EXAM: CHEST  1 VIEW COMPARISON:  Portable exam 1321 hours compared to 1034 hours and correlated with CT chest of 07/25/2021 FINDINGS: Atherosclerotic calcification aorta. Normal heart size, mediastinal contours, and pulmonary vascularity. Emphysematous and bronchitic changes consistent with COPD. Markers in RIGHT upper lobe at 2 sites of nodularity seen on prior CT. Tiny RIGHT apex pneumothorax without mediastinal shift. Remaining lungs clear. No infiltrate or pleural effusion. Bones demineralized with  evidence of prior thoracolumbar spinal fixation. IMPRESSION: Tiny RIGHT apex pneumothorax. COPD changes with markers placed at 2 sites of nodularity identified in the RIGHT upper lobe on recent CT. Aortic Atherosclerosis (ICD10-I70.0) and Emphysema (ICD10-J43.9). Critical Value/emergent results were called by telephone at the time of interpretation on 07/31/2021 at 1:54 pm to provider Ina Homes MD, who verbally acknowledged these results. Electronically Signed   By: Lavonia Dana M.D.   On: 07/31/2021 13:54   DG Chest 2 View  Result Date: 08/11/2021 CLINICAL DATA:  Shortness of breath. Follow-up pneumothorax after biopsy. EXAM: CHEST - 2 VIEW COMPARISON:  Chest x-ray 08/02/2021. FINDINGS: There is no evidence for pneumothorax. The lungs are clear. The cardiomediastinal silhouette is within normal limits. There is no pleural effusion. Punctate radiopaque markers overlie the right chest, unchanged. Thoracolumbar fusion hardware is partially visualized. IMPRESSION: No active cardiopulmonary disease. Electronically Signed   By: Ronney Asters M.D.   On: 08/11/2021 23:10  DG Chest 2 View  Result Date: 08/02/2021 CLINICAL DATA:  Follow-up pneumothorax EXAM: CHEST - 2 VIEW COMPARISON:  08/01/2021 FINDINGS: Stable size and appearance of a small to moderate right apical pneumothorax. No abnormal shift of the heart or mediastinal structures. Nodular densities within the right lung with adjacent fiduciary clips. Lungs are hyperinflated. No pleural effusion. No left-sided pneumothorax. Thoracolumbar fixation hardware traversing severe L1 compression fracture. IMPRESSION: Stable size and appearance of a small to moderate right apical pneumothorax. Electronically Signed   By: Davina Poke D.O.   On: 08/02/2021 10:56   DG Chest 2 View  Result Date: 08/01/2021 CLINICAL DATA:  Pneumnothorax right EXAM: CHEST - 2 VIEW COMPARISON:  07/31/2021. FINDINGS: Increased size of a moderate right pneumothorax. No appreciable  mediastinal shift. Cardiomediastinal silhouette is similar to prior. Mild nodular opacities in the right midlung in the region of 2 clips. Emphysema with hyperinflation. Partially imaged thoracolumbar spinal fusion hardware. Osteopenia. Similar height loss of a mid to lower thoracic vertebral body. IMPRESSION: 1. Increased size of the right pneumothorax, now moderate 2. Mild nodular opacities in the right midlung in the region of 2 clips, compatible with known pulmonary nodules and possibly superimposed post biopsy change. 3. Emphysema. These results will be called to the ordering clinician or representative by the Radiologist Assistant, and communication documented in the PACS or [employer]. Electronically Signed   By: Margaretha Sheffield M.D.   On: 08/01/2021 10:07   DG CHEST PORT 1 VIEW  Result Date: 07/31/2021 CLINICAL DATA:  Re-evaluate right-sided pneumothorax, status post EB U/S EXAM: PORTABLE CHEST 1 VIEW COMPARISON:  07/31/2021, 9:09 a.m. FINDINGS: Interval resolution of a previously seen small right pneumothorax. No appreciable residual pneumothorax. Marking clips present in the right upper lobe. The left lung is normally aerated. IMPRESSION: Interval resolution of a previously seen small right pneumothorax. Electronically Signed   By: Eddie Candle M.D.   On: 07/31/2021 11:02   DG CHEST PORT 1 VIEW  Result Date: 07/31/2021 CLINICAL DATA:  Post bronchoscopy with biopsy EXAM: PORTABLE CHEST 1 VIEW COMPARISON:  07/27/2014 FINDINGS: Patchy right mid lung opacity. Two clips are noted. A small to moderate pneumothorax is present. No pleural effusion. Cardiomediastinal contours are within normal limits. IMPRESSION: Small to moderate right pneumothorax post biopsy. Patchy right mid lung opacity with 2 clips probably reflects biopsy changes. These results will be called to the ordering clinician or representative by the Radiologist Assistant, and communication documented in the PACS or [employer].  Electronically Signed   By: Macy Mis M.D.   On: 07/31/2021 09:38   CT Super D Chest Wo Contrast  Result Date: 07/26/2021 CLINICAL DATA:  Right lung nodules EXAM: CT CHEST WITHOUT CONTRAST TECHNIQUE: Multidetector CT imaging of the chest was performed using thin slice collimation for electromagnetic bronchoscopy planning purposes, without intravenous contrast. COMPARISON:  CT chest 05/25/2021, PET-CT, 07/20/2021, thyroid ultrasound, 03/30/2021 FINDINGS: Cardiovascular: Aortic atherosclerosis. Normal heart size. No pericardial effusion. Mediastinum/Nodes: No enlarged mediastinal, hilar, or axillary lymph nodes. Unchanged large cystic right thyroid nodule, previously characterized by ultrasound. This has been evaluated on previous imaging. (ref: J Am Coll Radiol. 2015 Feb;12(2): 143-50). Trachea and esophagus demonstrate no significant findings. Lungs/Pleura: Moderate centrilobular emphysema. Redemonstrated spiculated nodule in the central right upper lobe is unchanged, measuring 1.3 by 0.8 cm (series 3, image 36). Redemonstrated spiculated, subsolid nodule of the inferior right upper lobe is unchanged, measuring 2.2 x 1.5 cm (series 3, image 71). No pleural effusion or pneumothorax. Upper Abdomen: No  acute abnormality. Musculoskeletal: No chest wall mass or suspicious bone lesions identified. IMPRESSION: 1. Redemonstrated spiculated nodule in the central right upper lobe is unchanged in comparison to recent PET-CT, measuring 1.3 by 0.8 cm, previously FDG avid and concerning for adenocarcinoma. 2. Redemonstrated spiculated, subsolid nodule of the inferior right upper lobe is unchanged, measuring 2.2 x 1.5 cm, likewise previously FDG avid and concerning for adenocarcinoma. 3. Emphysema. Aortic Atherosclerosis (ICD10-I70.0) and Emphysema (ICD10-J43.9). Electronically Signed   By: Eddie Candle M.D.   On: 07/26/2021 10:41   DG C-ARM BRONCHOSCOPY  Result Date: 07/31/2021 C-ARM BRONCHOSCOPY: Fluoroscopy was  utilized by the requesting physician.  No radiographic interpretation.      IMPRESSION: non-small cell carcinoma of the RUL and PET positive pulmonary nodule in the right upper lobe inferior location  The patient would be a good candidate for SBRT directed at both lesions.  In light of the patient's pulmonary situation and other medical issues she is not felt to be a candidate for surgery.  The patient understands that the area in the right lower lobe was not biopsied but is positive on PET consistent with slow-growing non-small cell lung cancer.  She does wish to have treatment to this area in addition.   Today, I talked to the patient and daughter-in-law about the findings and work-up thus far.  We discussed the natural history of non-small cell lung cancer and general treatment, highlighting the role of radiotherapy in the management.  We discussed the available radiation techniques, and focused on the details of logistics and delivery.  We reviewed the anticipated acute and late sequelae associated with radiation in this setting.  The patient was encouraged to ask questions that I answered to the best of my ability.  A patient consent form was discussed and signed.  We retained a copy for our records.  The patient would like to proceed with radiation and will be scheduled for CT simulation.  PLAN: The patient will return for simulation next week with treatments to begin approximately 2 weeks from now.  Anticipate 3 SBRT treatments directed at both areas of concern.   60 minutes of total time was spent for this patient encounter, including preparation, face-to-face counseling with the patient and coordination of care, physical exam, and documentation of the encounter.   ------------------------------------------------  Blair Promise, PhD, MD  This document serves as a record of services personally performed by Gery Pray, MD. It was created on his behalf by Roney Mans, a trained medical  scribe. The creation of this record is based on the scribe's personal observations and the provider's statements to them. This document has been checked and approved by the attending provider.

## 2021-08-20 ENCOUNTER — Ambulatory Visit
Admission: RE | Admit: 2021-08-20 | Discharge: 2021-08-20 | Disposition: A | Discharge status: Home / Self Care | Payer: Medicare Other | Source: Ambulatory Visit | Attending: Radiation Oncology | Admitting: Radiation Oncology

## 2021-08-20 ENCOUNTER — Other Ambulatory Visit: Payer: Self-pay

## 2021-08-20 ENCOUNTER — Encounter: Payer: Self-pay | Admitting: Radiation Oncology

## 2021-08-20 VITALS — BP 167/60 | HR 67 | Temp 97.5°F | Resp 18 | Ht 62.0 in | Wt 94.6 lb

## 2021-08-20 DIAGNOSIS — Z87891 Personal history of nicotine dependence: Secondary | ICD-10-CM | POA: Insufficient documentation

## 2021-08-20 DIAGNOSIS — M810 Age-related osteoporosis without current pathological fracture: Secondary | ICD-10-CM | POA: Diagnosis not present

## 2021-08-20 DIAGNOSIS — C3411 Malignant neoplasm of upper lobe, right bronchus or lung: Secondary | ICD-10-CM | POA: Insufficient documentation

## 2021-08-20 DIAGNOSIS — E7800 Pure hypercholesterolemia, unspecified: Secondary | ICD-10-CM | POA: Diagnosis not present

## 2021-08-20 DIAGNOSIS — I700 Atherosclerosis of aorta: Secondary | ICD-10-CM | POA: Diagnosis not present

## 2021-08-20 DIAGNOSIS — J432 Centrilobular emphysema: Secondary | ICD-10-CM | POA: Insufficient documentation

## 2021-08-20 DIAGNOSIS — J449 Chronic obstructive pulmonary disease, unspecified: Secondary | ICD-10-CM | POA: Insufficient documentation

## 2021-08-20 DIAGNOSIS — J939 Pneumothorax, unspecified: Secondary | ICD-10-CM | POA: Insufficient documentation

## 2021-08-20 NOTE — Progress Notes (Signed)
See MD note for nursing evaluation. °

## 2021-08-29 ENCOUNTER — Other Ambulatory Visit: Payer: Self-pay

## 2021-08-29 ENCOUNTER — Ambulatory Visit
Admission: RE | Admit: 2021-08-29 | Discharge: 2021-08-29 | Disposition: A | Discharge status: Home / Self Care | Payer: Medicare Other | Source: Ambulatory Visit | Attending: Radiation Oncology | Admitting: Radiation Oncology

## 2021-08-29 DIAGNOSIS — Z510 Encounter for antineoplastic radiation therapy: Secondary | ICD-10-CM | POA: Diagnosis not present

## 2021-08-29 DIAGNOSIS — C3411 Malignant neoplasm of upper lobe, right bronchus or lung: Secondary | ICD-10-CM | POA: Insufficient documentation

## 2021-08-29 LAB — FUNGUS CULTURE WITH STAIN

## 2021-08-29 LAB — FUNGUS CULTURE RESULT

## 2021-08-29 LAB — FUNGAL ORGANISM REFLEX

## 2021-09-06 ENCOUNTER — Ambulatory Visit: Payer: Medicare Other | Admitting: Radiation Oncology

## 2021-09-07 ENCOUNTER — Ambulatory Visit: Payer: Medicare Other | Admitting: Radiation Oncology

## 2021-09-08 DIAGNOSIS — C3411 Malignant neoplasm of upper lobe, right bronchus or lung: Secondary | ICD-10-CM | POA: Diagnosis not present

## 2021-09-08 DIAGNOSIS — Z510 Encounter for antineoplastic radiation therapy: Secondary | ICD-10-CM | POA: Diagnosis not present

## 2021-09-10 ENCOUNTER — Ambulatory Visit
Admission: RE | Admit: 2021-09-10 | Discharge: 2021-09-10 | Disposition: A | Discharge status: Home / Self Care | Payer: Medicare Other | Source: Ambulatory Visit | Attending: Radiation Oncology | Admitting: Radiation Oncology

## 2021-09-10 ENCOUNTER — Ambulatory Visit: Payer: Medicare Other | Admitting: Adult Health

## 2021-09-10 ENCOUNTER — Other Ambulatory Visit: Payer: Self-pay

## 2021-09-10 DIAGNOSIS — C3411 Malignant neoplasm of upper lobe, right bronchus or lung: Secondary | ICD-10-CM | POA: Diagnosis not present

## 2021-09-10 DIAGNOSIS — Z510 Encounter for antineoplastic radiation therapy: Secondary | ICD-10-CM | POA: Diagnosis not present

## 2021-09-12 ENCOUNTER — Other Ambulatory Visit: Payer: Self-pay

## 2021-09-12 ENCOUNTER — Ambulatory Visit
Admission: RE | Admit: 2021-09-12 | Discharge: 2021-09-12 | Disposition: A | Discharge status: Home / Self Care | Payer: Medicare Other | Source: Ambulatory Visit | Attending: Radiation Oncology | Admitting: Radiation Oncology

## 2021-09-12 DIAGNOSIS — R918 Other nonspecific abnormal finding of lung field: Secondary | ICD-10-CM | POA: Insufficient documentation

## 2021-09-12 DIAGNOSIS — C3411 Malignant neoplasm of upper lobe, right bronchus or lung: Secondary | ICD-10-CM | POA: Diagnosis not present

## 2021-09-13 LAB — ACID FAST CULTURE WITH REFLEXED SENSITIVITIES (MYCOBACTERIA): Acid Fast Culture: NEGATIVE

## 2021-09-17 ENCOUNTER — Encounter: Payer: Self-pay | Admitting: Radiation Oncology

## 2021-09-17 ENCOUNTER — Other Ambulatory Visit: Payer: Self-pay

## 2021-09-17 ENCOUNTER — Ambulatory Visit
Admission: RE | Admit: 2021-09-17 | Discharge: 2021-09-17 | Disposition: A | Discharge status: Home / Self Care | Payer: Medicare Other | Source: Ambulatory Visit | Attending: Radiation Oncology | Admitting: Radiation Oncology

## 2021-09-17 DIAGNOSIS — C3411 Malignant neoplasm of upper lobe, right bronchus or lung: Secondary | ICD-10-CM | POA: Diagnosis not present

## 2021-09-17 DIAGNOSIS — R918 Other nonspecific abnormal finding of lung field: Secondary | ICD-10-CM | POA: Diagnosis not present

## 2021-09-18 LAB — MISC LABCORP TEST (SEND OUT): Labcorp test code: 183402

## 2021-09-21 LAB — CULTURE, BAL-QUANTITATIVE W GRAM STAIN: Culture: 6000 — AB

## 2021-09-28 ENCOUNTER — Other Ambulatory Visit: Payer: Self-pay

## 2021-09-28 ENCOUNTER — Ambulatory Visit: Payer: Medicare Other | Admitting: Adult Health

## 2021-09-28 ENCOUNTER — Encounter: Payer: Self-pay | Admitting: Adult Health

## 2021-09-28 DIAGNOSIS — C3411 Malignant neoplasm of upper lobe, right bronchus or lung: Secondary | ICD-10-CM

## 2021-09-28 DIAGNOSIS — J439 Emphysema, unspecified: Secondary | ICD-10-CM

## 2021-09-28 NOTE — Progress Notes (Signed)
@Patient  ID: Virginia Rose, female    DOB: [birth year], 75 y.o.   MRN: [id number]  Chief Complaint  Patient presents with   Follow-up    Referring provider: [facility] Jerilynn Mages*  HPI: 75 year old female former smoker quit in 2022.  Patient has underlying COPD with emphysema.  Seen for pulmonary consult June 29, 2021 for lung nodules.  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 Notable 4.4 cm right thyroid lesion.  With no FDG uptake.  Has had previous thyroid ultrasound.   CT chest Super D July 25, 2021 showed a spiculated nodule in the right upper lobe measuring 1.3 x 0.8 cm, subsolid nodule in the right upper lobe 2.2 x 1.5 cm.,  Emphysema.   08/01/19 Bronch cytology -FINAL MICROSCOPIC DIAGNOSIS:  A. LUNG, RUL, BRUSHING:  - Malignant cells consistent with non-small cell carcinoma   B. LUNG, RUL, FINE NEEDLE ASPIRATION:  - Malignant cells consistent with non-small cell carcinoma  - See comment   C. LUNG, RUL TARGET #2 , BRUSHING:  - Rare atypical cells   D. LUNG, RUL TARGET #2 , FINE NEEDLE ASPIRATION:  - Rare atypical cells    ADDENDUM:  Immunohistochemistry shows there is focal, weak positivity  with p63.  TTF-1 and cytokeratin 5/6 are negative.  The immunophenotype  is nonspecific.     09/28/2021 Follow up ; COPD, lung cancer Patient returns for a 6-week follow-up.  Patient was seen in August for pulmonary consult for lung nodules.  She underwent robotic assisted bronchoscopy of 2 lung nodules in the right upper lobe.  Cytology showed non-small cell carcinoma.   PET scan showed hypermetabolic  activity in both nodules, no evidence of distant disease.  MRI brain was negative for metastatic disease.  Consistent with a stage Ia.  post bronchoscopy patient did experience a right-sided pneumothorax.  Follow-up chest x-ray August 10, 2021 showed clear lungs and no evidence of pneumothorax.  She was referred to oncology and radiation oncology.  Patient has multiple comorbidities felt not to be a surgical candidate.  She has completed 3 SBRT treatments.  Since last visit patient is feeling  Denies shortness of breath . Has Emphysema noted on CT . Never on inhalers. Denies any significant cough or congestion . We discussed setting up for PFT. She wants to hold off on this.  She has quit smoking recently , congratulated on cessation.   Has been dealing with speech and voice issues for last 6 months. As above MRI brain unrevealing. Says is some better recently . Denies memory impairment.  Has trouble finding words at times.  Has not seen neurology .  Declines neurology referral  Lives alone, drives. Reads often.   Allergies  Allergen Reactions   Latex     Band-aids, pull off skin    Immunization History  Administered Date(s) Administered   Fluad Quad(high Dose 65+) 08/25/2019, 08/10/2021   Influenza, High Dose Seasonal PF 08/19/2018   Influenza,inj,Quad PF,6+ Mos 08/04/2014   Moderna Sars-Covid-2 Vaccination 01/06/2020, 02/09/2020    Past Medical History:  Diagnosis Date   Arthritis    Complication of anesthesia    COPD (chronic obstructive  pulmonary disease) ([place])    History of blood transfusion    with back surgery procedure   Hypercholesteremia    Memory loss    Osteoporosis    Shortness of breath    with exertion, no oxygen   Smoker    quit 05/14/21  - 1ppd - pt started smoking at the age 20yrs old    Tobacco History: Social History   Tobacco Use  Smoking Status Former   Packs/day: 0.50   Years: 48.00   Pack years: 24.00   Types: Cigarettes   Quit date: 06/03/2021    Years since quitting: 0.3  Smokeless Tobacco Never   Counseling given: Not Answered   Outpatient Medications Prior to Visit  Medication Sig Dispense Refill   brimonidine (ALPHAGAN) 0.2 % ophthalmic solution Place 1 drop into both eyes 2 (two) times daily.     CALCIUM-VITAMIN D PO Take 2 tablets by mouth daily.     denosumab (PROLIA) 60 MG/ML SOSY injection Inject 60 mg into the skin every 6 (six) months.     dorzolamide (TRUSOPT) 2 % ophthalmic solution Place 1 drop into both eyes 2 (two) times daily.     No facility-administered medications prior to visit.     Review of Systems:   Constitutional:   No  weight loss, night sweats,  Fevers, chills,  +fatigue, or  lassitude.  HEENT:   No headaches,  Difficulty swallowing,  Tooth/dental problems, or  Sore throat,                No sneezing, itching, ear ache, nasal congestion, post nasal drip,   CV:  No chest pain,  Orthopnea, PND, swelling in lower extremities, anasarca, dizziness, palpitations, syncope.   GI  No heartburn, indigestion, abdominal pain, nausea, vomiting, diarrhea, change in bowel habits, loss of appetite, bloody stools.   Resp: No shortness of breath with exertion or at rest.  No excess mucus, no productive cough,  No non-productive cough,  No coughing up of blood.  No change in color of mucus.  No wheezing.  No chest wall deformity  Skin: no rash or lesions.  GU: no dysuria, change in color of urine, no urgency or frequency.  No flank pain, no hematuria   MS:  No joint pain or swelling.  No decreased range of motion.  No back pain.    Physical Exam  BP (!) 160/80 (BP Location: Left Arm, Patient Position: Sitting, Cuff Size: Normal)   Pulse 82   Temp 98 F (36.7 C) (Oral)   Ht 5\' 2"  (1.575 m)   Wt 98 lb 6.4 oz (44.6 kg)   SpO2 99%   BMI 18.00 kg/m   GEN: A/Ox3; pleasant , NAD, thin and frail .    HEENT:  [place]/AT,  NOSE-clear, THROAT-clear, no lesions, no postnasal drip or exudate noted.   NECK:   Supple w/ fair ROM; no JVD; normal carotid impulses w/o bruits; no thyromegaly or nodules palpated; no lymphadenopathy.    RESP  Clear  P & A; w/o, wheezes/ rales/ or rhonchi. no accessory muscle use, no dullness to percussion  CARD:  RRR, no m/r/g, no peripheral edema, pulses intact, no cyanosis or clubbing.  GI:   Soft & nt; nml bowel sounds; no organomegaly or masses detected.   Musco: Warm bil, no deformities or joint swelling noted.   Neuro: alert, no focal deficits noted.    Skin: Warm, no lesions or rashes    Lab Results:  CBC  Component Value Date/Time   WBC 5.9 08/13/2021 1401   WBC 6.5 07/31/2021 0552   RBC 4.02 08/13/2021 1401   HGB 12.7 08/13/2021 1401   HCT 37.8 08/13/2021 1401   PLT 235 08/13/2021 1401   MCV 94.0 08/13/2021 1401   MCH 31.6 08/13/2021 1401   MCHC 33.6 08/13/2021 1401   RDW 12.9 08/13/2021 1401   LYMPHSABS 1.8 08/13/2021 1401   MONOABS 0.5 08/13/2021 1401   EOSABS 0.1 08/13/2021 1401   BASOSABS 0.0 08/13/2021 1401    BMET    Component Value Date/Time   NA 135 08/13/2021 1401   K 4.1 08/13/2021 1401   CL 100 08/13/2021 1401   CO2 24 08/13/2021 1401   GLUCOSE 95 08/13/2021 1401   BUN 8 08/13/2021 1401   CREATININE 0.73 08/13/2021 1401   CALCIUM 9.4 08/13/2021 1401   GFRNONAA >60 08/13/2021 1401   GFRAA >90 07/27/2014 1033    BNP No results found for: BNP  ProBNP No results found for: PROBNP  Imaging: No results found.    No flowsheet data found.  No results found for: NITRICOXIDE      Assessment & Plan:   Lung cancer ([place]) Hypermetabolic RUL nodules x 2 -Stage 1 a NSCLC  S/p SBRT x 3 treatments   Keep follow up with Oncology and Radiation Oncology  Serial CT as planned   Plan  . Patient Instructions  High protein diet  Activity as tolerated.  Follow up with PCP for speech issues.  Follow up with Dr. Sondra Come as planned.   Follow up with Dr. Valeta Harms or Gordie Belvin NP in 6 months  and As needed         Emphysema/COPD [facility]) Patient with a history of heavy smoking.  Emphysema noted on CT scan.  Patient has minimum symptom burden.  She denies any significant shortness of breath or cough.  However she is somewhat sedentary. We discussed setting her up for pulmonary function testing.  Patient declines at this time.  Offered to add in an albuterol inhaler as needed however she declines this as well.  Plan Patient Instructions  High protein diet  Activity as tolerated.  Follow up with PCP for speech issues.  Follow up with Dr. Sondra Come as planned.   Follow up with Dr. Valeta Harms or Ermin Parisien NP in 6 months  and As needed          Rexene Edison, NP 09/28/2021

## 2021-09-28 NOTE — Patient Instructions (Addendum)
High protein diet  Activity as tolerated.  Follow up with PCP for speech issues.  Follow up with Dr. Sondra Come as planned.   Follow up with Dr. Valeta Harms or Jett Kulzer NP in 6 months  and As needed

## 2021-09-28 NOTE — Assessment & Plan Note (Signed)
Hypermetabolic RUL nodules x 2 -Stage 1 a NSCLC  S/p SBRT x 3 treatments   Keep follow up with Oncology and Radiation Oncology  Serial CT as planned   Plan  . Patient Instructions  High protein diet  Activity as tolerated.  Follow up with PCP for speech issues.  Follow up with Dr. Sondra Come as planned.   Follow up with Dr. Valeta Harms or Jakiera Ehler NP in 6 months  and As needed

## 2021-09-28 NOTE — Assessment & Plan Note (Signed)
Patient with a history of heavy smoking.  Emphysema noted on CT scan.  Patient has minimum symptom burden.  She denies any significant shortness of breath or cough.  However she is somewhat sedentary. We discussed setting her up for pulmonary function testing.  Patient declines at this time.  Offered to add in an albuterol inhaler as needed however she declines this as well.  Plan Patient Instructions  High protein diet  Activity as tolerated.  Follow up with PCP for speech issues.  Follow up with Dr. Sondra Come as planned.   Follow up with Dr. Valeta Harms or Farra Nikolic NP in 6 months  and As needed

## 2021-10-16 DIAGNOSIS — H40021 Open angle with borderline findings, high risk, right eye: Secondary | ICD-10-CM | POA: Diagnosis not present

## 2021-10-16 DIAGNOSIS — H26493 Other secondary cataract, bilateral: Secondary | ICD-10-CM | POA: Diagnosis not present

## 2021-10-16 DIAGNOSIS — Z961 Presence of intraocular lens: Secondary | ICD-10-CM | POA: Diagnosis not present

## 2021-10-16 DIAGNOSIS — H401121 Primary open-angle glaucoma, left eye, mild stage: Secondary | ICD-10-CM | POA: Diagnosis not present

## 2021-10-19 ENCOUNTER — Encounter: Payer: Self-pay | Admitting: Radiology

## 2021-10-24 NOTE — Progress Notes (Signed)
Radiation Oncology         [phone] ________________________________  Name: Virginia Rose MRN: [id number]  Date: 10/25/2021  DOB: 23-Aug-1946  Follow-Up Visit Note  CC: [facility], Octavio Graves, DO    ICD-10-CM   1. Malignant neoplasm of upper lobe of right lung [facility])  C34.11       Diagnosis:  The encounter diagnosis was Malignant neoplasm of upper lobe of right lung ([place]).   Non-small cell carcinoma of the RUL, PET positive nodule in the right inferior upper lobe  Interval Since Last Radiation: 1 month and 8 days   Intent: Curative  Radiation Treatment Dates: 09/10/2021 through 09/17/2021 Site Technique Total Dose (Gy) Dose per Fx (Gy) Completed Fx Beam Energies  Lung, Right: Lung_Rt_SBRT IMRT 54/54 18 3/3 6XFFF    Narrative:  The patient returns today for routine follow-up. The patient tolerated radiation therapy relatively well other than reports of hoariness and a cough productive of think yellow sputum on the day of her final treatment.  She denied swallowing issues, or shortness of breath. Skin remains intact throughout treatment.  Expressive aphasia was noted with the patient on the day of her final treatment check.                  Since completing treatment, the patient followed up with her pulmonologist, Rexene Edison NP, on 09/28/21. During which time, the patient was noted to deny SOB, significant cough, or congestion . Setting up for PFT was discussed though the patient wanted to hold off on this. Patient was also noted to have to quit smoking recently which is great news. Additionally, the patient reported speech and voice issues for last 6 months defined by trouble finding words at times. The patient was offered neurology referral but declined and was advised to follow up with her PCP in regards to this.   The patient has not undergone any additional imaging since she was last seen.   She denies any excessive coughing.  She  denies any hemoptysis or pain within the chest area.  Energy level is back to normal.  Allergies:  is allergic to latex.  Meds: Current Outpatient Medications  Medication Sig Dispense Refill   brimonidine (ALPHAGAN) 0.2 % ophthalmic solution Place 1 drop into both eyes 2 (two) times daily.     denosumab (PROLIA) 60 MG/ML SOSY injection Inject 60 mg into the skin every 6 (six) months.     dorzolamide (TRUSOPT) 2 % ophthalmic solution Place 1 drop into both eyes 2 (two) times daily.     CALCIUM-VITAMIN D PO Take 2 tablets by mouth daily. (Patient not taking: Reported on 10/25/2021)     No current facility-administered medications for this encounter.    Physical Findings: The patient is in no acute distress. Patient is alert and oriented.  height is 5\' 2"  (1.575 m) and weight is 100 lb 9.6 oz (45.6 kg). Her temperature is 97.6 F (36.4 C). Her blood pressure is 168/72 (abnormal) and her pulse is 65. Her respiration is 18 and oxygen saturation is 100%. .  No significant changes. Lungs are clear to auscultation bilaterally. Heart has regular rate and rhythm. No palpable cervical, supraclavicular, or axillary adenopathy. Abdomen soft, non-tender, normal bowel sounds.    Lab Findings: Lab Results  Component Value Date   WBC 5.9 08/13/2021   HGB 12.7 08/13/2021   HCT 37.8 08/13/2021   MCV 94.0 08/13/2021   PLT 235 08/13/2021  Radiographic Findings: No results found.  Impression:  The encounter diagnosis was Malignant neoplasm of upper lobe of right lung ([place]).   Non-small cell carcinoma of the RUL, PET positive nodule in the right inferior upper lobe   The patient is recovering from the effects of radiation.  She reports no changes in her breathing since completion of SBRT.  Plan: Patient will follow-up with Dr. Julien Nordmann in February.  Prior to this appointment she will undergo a chest CT scan.  In light of the patient's close follow-up in medical oncology have not scheduled her for  formal follow-up appointment but would be glad to see her at any time.  I ask that Dr. Julien Nordmann keep me abreast of the patient's progress.   _____________________  Blair Promise, PhD, MD   This document serves as a record of services personally performed by Gery Pray, MD. It was created on his behalf by Roney Mans, a trained medical scribe. The creation of this record is based on the scribe's personal observations and the provider's statements to them. This document has been checked and approved by the attending provider.

## 2021-10-24 NOTE — Progress Notes (Incomplete)
°  Radiation Oncology         [phone] ________________________________  Patient Name: Virginia Rose MRN: [id number] DOB: 1946-01-16 Referring Physician: June Leap Date of Service: 09/17/2021 [facility], [place]  End Of Treatment Note  Diagnoses: C34.11-Malignant neoplasm of upper lobe, right bronchus or lung  Cancer Staging: The encounter diagnosis was Malignant neoplasm of upper lobe of right lung ([place]).   Non-small cell carcinoma of the RUL, PET positive nodule in the right inferior upper lobe  Intent: Curative  Radiation Treatment Dates: 09/10/2021 through 09/17/2021 Site Technique Total Dose (Gy) Dose per Fx (Gy) Completed Fx Beam Energies  Lung, Right: Lung_Rt_SBRT IMRT 54/54 18 3/3 6XFFF   Narrative: The patient tolerated radiation therapy relatively well. Patient reports tolerating treatments well other than hoariness and a cough productive of think yellow sputum.  She denies swallowing issues, or shortness of breath. Skin remains intact.  Accompanied by her daughter on evaluation today. During physical exam, expressive aphasia is noted with the patient.  Plan: Overall she tolerated treatment well without any significant side effects at this point.  Routine follow-up in 1 month.  She will meet with Dr. Julien Nordmann in February with scans prior to that follow-up appointment. ________________________________________________ -----------------------------------  Blair Promise, PhD, MD  This document serves as a record of services personally performed by Gery Pray, MD. It was created on his behalf by Roney Mans, a trained medical scribe. The creation of this record is based on the scribe's personal observations and the provider's statements to them. This document has been checked and approved by the attending provider.

## 2021-10-25 ENCOUNTER — Ambulatory Visit
Admission: RE | Admit: 2021-10-25 | Discharge: 2021-10-25 | Disposition: A | Discharge status: Home / Self Care | Payer: Medicare Other | Source: Ambulatory Visit | Attending: Radiation Oncology | Admitting: Radiation Oncology

## 2021-10-25 ENCOUNTER — Other Ambulatory Visit: Payer: Self-pay

## 2021-10-25 ENCOUNTER — Encounter: Payer: Self-pay | Admitting: Radiation Oncology

## 2021-10-25 VITALS — BP 168/72 | HR 65 | Temp 97.6°F | Resp 18 | Ht 62.0 in | Wt 100.6 lb

## 2021-10-25 DIAGNOSIS — C3411 Malignant neoplasm of upper lobe, right bronchus or lung: Secondary | ICD-10-CM | POA: Diagnosis not present

## 2021-10-25 NOTE — Progress Notes (Signed)
Virginia Rose is here today for follow up post radiation to the lung.  Lung Side: right  Completed radiation treatment on: 09/17/2021  Does the patient complain of any of the following: Pain:denies Shortness of breath w/wo exertion: denies Cough: occasional productive cough with yellow phlegm Hemoptysis: denies Pain with swallowing: denies Swallowing/choking concerns: denies Appetite: improving Energy Level: fair but improving Post radiation skin Changes: back itching    Additional comments if applicable: none  Vitals:   10/25/21 1133  BP: (!) 168/72  Pulse: 65  Resp: 18  Temp: 97.6 F (36.4 C)  SpO2: 100%  Weight: 100 lb 9.6 oz (45.6 kg)  Height: 5\' 2"  (1.575 m)

## 2021-11-06 DIAGNOSIS — M810 Age-related osteoporosis without current pathological fracture: Secondary | ICD-10-CM | POA: Diagnosis not present

## 2021-12-13 ENCOUNTER — Ambulatory Visit (HOSPITAL_COMMUNITY)
Admission: RE | Admit: 2021-12-13 | Discharge: 2021-12-13 | Disposition: A | Discharge status: Home / Self Care | Payer: Medicare Other | Source: Ambulatory Visit | Attending: Internal Medicine | Admitting: Internal Medicine

## 2021-12-13 ENCOUNTER — Other Ambulatory Visit: Payer: Self-pay

## 2021-12-13 DIAGNOSIS — R918 Other nonspecific abnormal finding of lung field: Secondary | ICD-10-CM | POA: Diagnosis not present

## 2021-12-13 DIAGNOSIS — C3490 Malignant neoplasm of unspecified part of unspecified bronchus or lung: Secondary | ICD-10-CM | POA: Diagnosis not present

## 2021-12-13 DIAGNOSIS — Q676 Pectus excavatum: Secondary | ICD-10-CM | POA: Diagnosis not present

## 2021-12-13 DIAGNOSIS — J439 Emphysema, unspecified: Secondary | ICD-10-CM | POA: Diagnosis not present

## 2021-12-13 DIAGNOSIS — R911 Solitary pulmonary nodule: Secondary | ICD-10-CM | POA: Diagnosis not present

## 2021-12-13 LAB — POCT I-STAT CREATININE: Creatinine, Ser: 0.8 mg/dL (ref 0.44–1.00)

## 2021-12-13 MED ORDER — IOHEXOL 300 MG/ML  SOLN
100.0000 mL | Freq: Once | INTRAMUSCULAR | Status: AC | PRN
  Administered 2021-12-13: 75 mL via INTRAVENOUS

## 2021-12-13 MED ORDER — SODIUM CHLORIDE (PF) 0.9 % IJ SOLN
INTRAMUSCULAR | Status: AC
  Filled 2021-12-13: qty 50

## 2021-12-17 ENCOUNTER — Encounter: Payer: Self-pay | Admitting: *Deleted

## 2021-12-17 ENCOUNTER — Other Ambulatory Visit: Payer: Self-pay

## 2021-12-17 ENCOUNTER — Inpatient Hospital Stay: Payer: Medicare Other

## 2021-12-17 ENCOUNTER — Inpatient Hospital Stay: Payer: Medicare Other | Attending: Internal Medicine | Admitting: Internal Medicine

## 2021-12-17 VITALS — BP 172/75 | HR 79 | Temp 97.0°F | Resp 18 | Ht 62.0 in | Wt 105.4 lb

## 2021-12-17 DIAGNOSIS — C3490 Malignant neoplasm of unspecified part of unspecified bronchus or lung: Secondary | ICD-10-CM

## 2021-12-17 DIAGNOSIS — I2510 Atherosclerotic heart disease of native coronary artery without angina pectoris: Secondary | ICD-10-CM | POA: Insufficient documentation

## 2021-12-17 DIAGNOSIS — C3411 Malignant neoplasm of upper lobe, right bronchus or lung: Secondary | ICD-10-CM

## 2021-12-17 DIAGNOSIS — Z85118 Personal history of other malignant neoplasm of bronchus and lung: Secondary | ICD-10-CM | POA: Insufficient documentation

## 2021-12-17 DIAGNOSIS — I10 Essential (primary) hypertension: Secondary | ICD-10-CM | POA: Insufficient documentation

## 2021-12-17 LAB — CBC WITH DIFFERENTIAL (CANCER CENTER ONLY)
Abs Immature Granulocytes: 0.02 10*3/uL (ref 0.00–0.07)
Basophils Absolute: 0 10*3/uL (ref 0.0–0.1)
Basophils Relative: 1 %
Eosinophils Absolute: 0.1 10*3/uL (ref 0.0–0.5)
Eosinophils Relative: 2 %
HCT: 34.4 % — ABNORMAL LOW (ref 36.0–46.0)
Hemoglobin: 12.1 g/dL (ref 12.0–15.0)
Immature Granulocytes: 0 %
Lymphocytes Relative: 24 %
Lymphs Abs: 1.1 10*3/uL (ref 0.7–4.0)
MCH: 34.3 pg — ABNORMAL HIGH (ref 26.0–34.0)
MCHC: 35.2 g/dL (ref 30.0–36.0)
MCV: 97.5 fL (ref 80.0–100.0)
Monocytes Absolute: 0.5 10*3/uL (ref 0.1–1.0)
Monocytes Relative: 10 %
Neutro Abs: 2.8 10*3/uL (ref 1.7–7.7)
Neutrophils Relative %: 63 %
Platelet Count: 196 10*3/uL (ref 150–400)
RBC: 3.53 MIL/uL — ABNORMAL LOW (ref 3.87–5.11)
RDW: 13.2 % (ref 11.5–15.5)
WBC Count: 4.5 10*3/uL (ref 4.0–10.5)
nRBC: 0 % (ref 0.0–0.2)

## 2021-12-17 LAB — CMP (CANCER CENTER ONLY)
ALT: 14 U/L (ref 0–44)
AST: 20 U/L (ref 15–41)
Albumin: 4.5 g/dL (ref 3.5–5.0)
Alkaline Phosphatase: 47 U/L (ref 38–126)
Anion gap: 9 (ref 5–15)
BUN: 11 mg/dL (ref 8–23)
CO2: 25 mmol/L (ref 22–32)
Calcium: 9.5 mg/dL (ref 8.9–10.3)
Chloride: 102 mmol/L (ref 98–111)
Creatinine: 0.63 mg/dL (ref 0.44–1.00)
GFR, Estimated: >60 mL/min (ref >60)
Glucose, Bld: 83 mg/dL (ref 70–99)
Potassium: 4.5 mmol/L (ref 3.5–5.1)
Sodium: 136 mmol/L (ref 135–145)
Total Bilirubin: 0.5 mg/dL (ref 0.3–1.2)
Total Protein: 7.5 g/dL (ref 6.5–8.1)

## 2021-12-17 NOTE — Progress Notes (Signed)
I was able to speak to patient and her daughter today at their visit to see Dr. Julien Nordmann.  Patient is having some cough and Dr. Julien Nordmann addressed.  She is also have some slowed speech. Dr. Julien Nordmann recommended patient see neurology. Patient daughter verbalized understanding.

## 2021-12-17 NOTE — Progress Notes (Signed)
[place] [phone]   Fax:([phone]) [place], [place] @ [place] [address]  DIAGNOSIS: Stage IA (T1b, N0, M0) non-small cell lung cancer presented with right upper lobe lung nodule that biopsy confirmed in September 2022.   PRIOR THERAPY: SBRT to the right upper lobe lung nodule under the care of Dr. Sondra Come completed on September 17, 2021  CURRENT THERAPY: Observation.  INTERVAL HISTORY: Virginia Rose 76 y.o. female returns to the clinic today for follow-up visit accompanied by her daughter-in-law.  The patient is feeling fine today with no concerning complaints except for the weak voice that was aggravated after her bronchoscopy.  She also continues to have dry cough.  She denied having any chest pain, shortness of breath except with exertion and no hemoptysis.  She denied having any weight loss or night sweats.  She has no nausea, vomiting, diarrhea or constipation.  She has no headache or visual changes.  She had repeat CT scan of the chest performed recently and she is here for evaluation and discussion of her scan results.  MEDICAL HISTORY: Past Medical History:  Diagnosis Date   Arthritis    Complication of anesthesia    COPD (chronic obstructive pulmonary disease) ([place])    History of blood transfusion    with back surgery procedure   History of radiation therapy    right lung 09/10/2021, 09/12/2021, 09/17/2021  Dr Gery Pray   Hypercholesteremia    Memory loss    Osteoporosis    Shortness of breath    with exertion, no oxygen   Smoker    quit 05/14/21  - 1ppd - pt started smoking at the age 32yrs old    ALLERGIES:  is allergic to latex.  MEDICATIONS:  Current Outpatient Medications  Medication Sig Dispense Refill   brimonidine (ALPHAGAN) 0.2 % ophthalmic solution Place 1 drop into both eyes 2 (two) times daily.     CALCIUM-VITAMIN D PO Take 2 tablets by mouth  daily. (Patient not taking: Reported on 10/25/2021)     denosumab (PROLIA) 60 MG/ML SOSY injection Inject 60 mg into the skin every 6 (six) months.     dorzolamide (TRUSOPT) 2 % ophthalmic solution Place 1 drop into both eyes 2 (two) times daily.     No current facility-administered medications for this visit.    SURGICAL HISTORY:  Past Surgical History:  Procedure Laterality Date   ABDOMINAL HYSTERECTOMY     APPENDECTOMY     BRONCHIAL BIOPSY  07/31/2021   Procedure: BRONCHIAL BIOPSIES;  Surgeon: Garner Nash, DO;  Location: [facility];  Service: Pulmonary;;   BRONCHIAL BRUSHINGS  07/31/2021   Procedure: BRONCHIAL BRUSHINGS;  Surgeon: Garner Nash, DO;  Location: [facility];  Service: Pulmonary;;   BRONCHIAL NEEDLE ASPIRATION BIOPSY  07/31/2021   Procedure: BRONCHIAL NEEDLE ASPIRATION BIOPSIES;  Surgeon: Garner Nash, DO;  Location: [facility];  Service: Pulmonary;;   BRONCHIAL WASHINGS  07/31/2021   Procedure: BRONCHIAL WASHINGS;  Surgeon: Garner Nash, DO;  Location: [place];  Service: Pulmonary;;   COLONOSCOPY W/ POLYPECTOMY     EYE SURGERY     bilateral cataract removal   FIDUCIAL MARKER PLACEMENT  07/31/2021   Procedure: FIDUCIAL MARKER PLACEMENT;  Surgeon: Garner Nash, DO;  [facility]: [facility];  Service: Pulmonary;;   POSTERIOR LUMBAR FUSION 4 LEVEL N/A 08/03/2014   Procedure: THORACIC TEN-ELEVEN,THORACIC ELEVEN-TWELVE,THORACIC TWELVE-LUMBAR ONE,LUMBARTWO-THREE,LUMBAR THREE-FOUR POSTERIOR  LATERAL FUSION;  Surgeon: Elaina Hoops, MD;  Location: [facility];  Service: Neurosurgery;  Laterality: N/A;  Site includes thoracic and lumbar spine   VIDEO BRONCHOSCOPY WITH ENDOBRONCHIAL NAVIGATION N/A 07/31/2021   Procedure: VIDEO BRONCHOSCOPY WITH ENDOBRONCHIAL NAVIGATION;  Surgeon: Garner Nash, DO;  Location: [facility];  Service: Pulmonary;  Laterality: N/A;  ION   VIDEO BRONCHOSCOPY WITH RADIAL ENDOBRONCHIAL ULTRASOUND  07/31/2021   Procedure: RADIAL  ENDOBRONCHIAL ULTRASOUND;  Surgeon: Garner Nash, DO;  Location: [facility];  Service: Pulmonary;;    REVIEW OF SYSTEMS:  A comprehensive review of systems was negative except for: Ears, nose, mouth, throat, and face: positive for voice change Respiratory: positive for cough and dyspnea on exertion   PHYSICAL EXAMINATION: General appearance: alert, cooperative, and no distress Head: Normocephalic, without obvious abnormality, atraumatic Neck: no adenopathy, no JVD, supple, symmetrical, trachea midline, and thyroid not enlarged, symmetric, no tenderness/mass/nodules Lymph nodes: Cervical, supraclavicular, and axillary nodes normal. Resp: clear to auscultation bilaterally Back: symmetric, no curvature. ROM normal. No CVA tenderness. Cardio: regular rate and rhythm, S1, S2 normal, no murmur, click, rub or gallop GI: soft, non-tender; bowel sounds normal; no masses,  no organomegaly Extremities: extremities normal, atraumatic, no cyanosis or edema  ECOG PERFORMANCE STATUS: 1 - Symptomatic but completely ambulatory  Blood pressure (!) 172/75, pulse 79, temperature (!) 97 F (36.1 C), temperature source Tympanic, resp. rate 18, height 5\' 2"  (1.575 m), weight 105 lb 6.4 oz (47.8 kg), SpO2 97 %.  LABORATORY DATA: Lab Results  Component Value Date   WBC 4.5 12/17/2021   HGB 12.1 12/17/2021   HCT 34.4 (L) 12/17/2021   MCV 97.5 12/17/2021   PLT 196 12/17/2021      Chemistry      Component Value Date/Time   NA 136 12/17/2021 1008   K 4.5 12/17/2021 1008   CL 102 12/17/2021 1008   CO2 25 12/17/2021 1008   BUN 11 12/17/2021 1008   CREATININE 0.63 12/17/2021 1008      Component Value Date/Time   CALCIUM 9.5 12/17/2021 1008   ALKPHOS 47 12/17/2021 1008   AST 20 12/17/2021 1008   ALT 14 12/17/2021 1008   BILITOT 0.5 12/17/2021 1008       RADIOGRAPHIC STUDIES: CT Chest W Contrast  Result Date: 12/14/2021 CLINICAL DATA:  Non-small-cell lung cancer of right upper lobe. PET  positive nodule in the inferior right upper lobe. Status post radiation therapy. EXAM: CT CHEST WITH CONTRAST TECHNIQUE: Multidetector CT imaging of the chest was performed during intravenous contrast administration. RADIATION DOSE REDUCTION: This exam was performed according to the departmental dose-optimization program which includes automated exposure control, adjustment of the mA and/or kV according to patient size and/or use of iterative reconstruction technique. CONTRAST:  68mL OMNIPAQUE IOHEXOL 300 MG/ML  SOLN COMPARISON:  07/25/2021 chest CT.  07/20/2021 PET. FINDINGS: Cardiovascular: Aortic atherosclerosis. Normal heart size, without pericardial effusion. Left circumflex coronary artery calcification. No central pulmonary embolism, on this non-dedicated study. Mediastinum/Nodes: No supraclavicular adenopathy. 1.3 cm right-sided thyroid nodule. Not clinically significant; no follow-up imaging recommended (ref: J Am Coll Radiol. 2015 Feb;12(2): 143-50) No mediastinal or hilar adenopathy. Lungs/Pleura: Trace inferior right pleural thickening is similar. Moderate centrilobular emphysema. Right upper lobe/apical pulmonary nodule measures 1.0 x 0.7 cm on 38/7 versus 1.3 x 0.8 cm on 07/25/2021. Radiation fiducials within. Inferior right upper lobe spiculated nodule appears more solid today, measuring 1.7 x 1.4 cm on 71/7 versus 2.2 x 1.5 cm on the prior chest  CT. Mild surrounding interstitial thickening and ground-glass which are likely radiation induced. Similar right middle lobe and developing lingular scarring. Suspect a 3 mm lingular nodule on 105/7, likely similar. Subpleural superior segment left lower lobe 4 mm nodule on 50/7 is unchanged. Upper Abdomen: Segment 4 B subcentimeter hepatic cyst. Normal imaged portions of the spleen, pancreas, kidneys. Mild bilateral adrenal thickening and nodularity is felt to be similar. No dominant mass. Proximal gastric underdistention. Musculoskeletal: Osteopenia. Pectus  excavatum deformity. Lower thoracic spine fixation, secondary to a severe compression deformity at L1. Mild T8 superior endplate compression deformity is unchanged. IMPRESSION: 1. Response to therapy of right apical pulmonary nodule. The more inferior right upper lobe nodule appears slightly more solid today, measuring smaller. Difference in morphology favored to be due to interval radiation therapy. 2. No thoracic adenopathy or findings of metastatic disease. 3. Tiny pulmonary nodules are unchanged, warranting follow-up attention. 4. Aortic atherosclerosis (ICD10-I70.0), coronary artery atherosclerosis and emphysema (ICD10-J43.9). Electronically Signed   By: Abigail Miyamoto M.D.   On: 12/14/2021 16:46    ASSESSMENT AND PLAN: This is a very pleasant 76 years old white female diagnosed with a stage IA (T1b, N0, M0) non-small cell lung cancer presented with right upper lobe lung nodule status post SBRT under the care of Dr. Sondra Come completed in November 2022. The patient is currently on observation and she is feeling fine. She had repeat CT scan of the chest performed recently.  I personally and independently reviewed the scan images and discussed the result with the patient and her daughter-in-law. Her scan showed no concerning findings for disease progression and she has mild decrease in the size of the pulmonary nodule after treatment. I recommended for her to continue on observation with repeat CT scan of the chest in 6 months. Regarding the change in her voice, I recommended for the patient to seek referral by her primary care physician to neurology. For the coronary artery disease and hypertension, she was advised to take her medication as prescribed and to continue her follow-up visit by her primary care physician. The patient was advised to call immediately if she has any other concerning symptoms in the interval. The patient voices understanding of current disease status and treatment options and is in  agreement with the current care plan.  All questions were answered. The patient knows to call the clinic with any problems, questions or concerns. We can certainly see the patient much sooner if necessary.  The total time spent in the appointment was 20 minutes.  Disclaimer: This note was dictated with voice recognition software. Similar sounding words can inadvertently be transcribed and may not be corrected upon review.

## 2021-12-19 ENCOUNTER — Other Ambulatory Visit: Payer: Self-pay | Admitting: Family Medicine

## 2021-12-19 DIAGNOSIS — Z1231 Encounter for screening mammogram for malignant neoplasm of breast: Secondary | ICD-10-CM

## 2021-12-20 DIAGNOSIS — R479 Unspecified speech disturbances: Secondary | ICD-10-CM | POA: Diagnosis not present

## 2021-12-20 DIAGNOSIS — R030 Elevated blood-pressure reading, without diagnosis of hypertension: Secondary | ICD-10-CM | POA: Diagnosis not present

## 2021-12-20 DIAGNOSIS — Z85118 Personal history of other malignant neoplasm of bronchus and lung: Secondary | ICD-10-CM | POA: Diagnosis not present

## 2022-01-01 ENCOUNTER — Encounter: Payer: Self-pay | Admitting: Neurology

## 2022-01-01 ENCOUNTER — Ambulatory Visit: Payer: Medicare Other | Admitting: Neurology

## 2022-01-01 VITALS — BP 180/75 | HR 86 | Ht 62.0 in | Wt 106.5 lb

## 2022-01-01 DIAGNOSIS — R471 Dysarthria and anarthria: Secondary | ICD-10-CM | POA: Diagnosis not present

## 2022-01-01 DIAGNOSIS — M810 Age-related osteoporosis without current pathological fracture: Secondary | ICD-10-CM | POA: Insufficient documentation

## 2022-01-01 DIAGNOSIS — R7301 Impaired fasting glucose: Secondary | ICD-10-CM | POA: Insufficient documentation

## 2022-01-01 DIAGNOSIS — M8448XA Pathological fracture, other site, initial encounter for fracture: Secondary | ICD-10-CM | POA: Insufficient documentation

## 2022-01-01 DIAGNOSIS — Z8711 Personal history of peptic ulcer disease: Secondary | ICD-10-CM | POA: Insufficient documentation

## 2022-01-01 DIAGNOSIS — Z681 Body mass index (BMI) 19 or less, adult: Secondary | ICD-10-CM | POA: Insufficient documentation

## 2022-01-01 DIAGNOSIS — R194 Change in bowel habit: Secondary | ICD-10-CM | POA: Insufficient documentation

## 2022-01-01 DIAGNOSIS — C3411 Malignant neoplasm of upper lobe, right bronchus or lung: Secondary | ICD-10-CM

## 2022-01-01 DIAGNOSIS — E785 Hyperlipidemia, unspecified: Secondary | ICD-10-CM | POA: Insufficient documentation

## 2022-01-01 DIAGNOSIS — M47816 Spondylosis without myelopathy or radiculopathy, lumbar region: Secondary | ICD-10-CM | POA: Insufficient documentation

## 2022-01-01 DIAGNOSIS — E041 Nontoxic single thyroid nodule: Secondary | ICD-10-CM | POA: Insufficient documentation

## 2022-01-01 DIAGNOSIS — E559 Vitamin D deficiency, unspecified: Secondary | ICD-10-CM | POA: Insufficient documentation

## 2022-01-01 NOTE — Patient Instructions (Addendum)
Referral to speech therapy  Follow up with your primary care doctor, consider ENT referral if symptoms do not improved or getting worse  Return if worse

## 2022-01-01 NOTE — Progress Notes (Signed)
[facility]  PATIENT: Virginia Rose DOB: 1946-03-03  REQUESTING CLINICIAN: Orvan July, NP HISTORY FROM: Patient  REASON FOR VISIT: Dysarthria    HISTORICAL  CHIEF COMPLAINT:  Chief Complaint  Patient presents with   New Patient (Initial Visit)    Rm 13. Alone. NP proficient paper referral for speech abnormality/[name], NP [place] [facility].    HISTORY OF PRESENT ILLNESS:  This is a 76 year old woman past medical history of lung cancer treated with radiation therapy, lumbar disc disease status post surgery and COPD who is presenting with speech difficulty.  Patient describes speech difficulty has hesitancy, reports her voice is comes and goes, and this started after she started radiation therapy. She reported her last session was in November 2022.  Denies any problems with eating, no trouble drinking, denies choking with liquid or solid and denies neck pain.  She was told she may experience hoarseness of voice after radiation therapy but her symptoms are not improving.  During this time her pulmonologist had completed a brain MRI with and without contrast which was negative for metastasis and negative for any acute stroke    OTHER MEDICAL CONDITIONS: Lung cancer, lumbar disc disease, COPD    REVIEW OF SYSTEMS: Full 14 system review of systems performed and negative with exception of: as noted in the HPI   ALLERGIES: Allergies  Allergen Reactions   Latex     Band-aids, pull off skin    HOME MEDICATIONS: Outpatient Medications Prior to Visit  Medication Sig Dispense Refill   brimonidine (ALPHAGAN) 0.2 % ophthalmic solution Place 1 drop into both eyes 2 (two) times daily.     CALCIUM-VITAMIN D PO Take 2 tablets by mouth daily.     denosumab (PROLIA) 60 MG/ML SOSY injection Inject 60 mg into the skin every 6 (six) months.     dorzolamide (TRUSOPT) 2 % ophthalmic solution Place 1 drop into both eyes 2 (two) times daily.     No  facility-administered medications prior to visit.    PAST MEDICAL HISTORY: Past Medical History:  Diagnosis Date   Arthritis    Complication of anesthesia    COPD (chronic obstructive pulmonary disease) ([place])    History of blood transfusion    with back surgery procedure   History of radiation therapy    right lung 09/10/2021, 09/12/2021, 09/17/2021  Dr Gery Pray   Hypercholesteremia    Memory loss    Osteoporosis    Shortness of breath    with exertion, no oxygen   Smoker    quit 05/14/21  - 1ppd - pt started smoking at the age 49yrs old    PAST SURGICAL HISTORY: Past Surgical History:  Procedure Laterality Date   ABDOMINAL HYSTERECTOMY     APPENDECTOMY     BRONCHIAL BIOPSY  07/31/2021   Procedure: BRONCHIAL BIOPSIES;  Surgeon: Garner Nash, DO;  Location: [facility];  Service: Pulmonary;;   BRONCHIAL BRUSHINGS  07/31/2021   Procedure: BRONCHIAL BRUSHINGS;  Surgeon: Garner Nash, DO;  Location: [facility];  Service: Pulmonary;;   BRONCHIAL NEEDLE ASPIRATION BIOPSY  07/31/2021   Procedure: BRONCHIAL NEEDLE ASPIRATION BIOPSIES;  Surgeon: Garner Nash, DO;  Location: [place];  Service: Pulmonary;;   BRONCHIAL WASHINGS  07/31/2021   Procedure: BRONCHIAL WASHINGS;  Surgeon: Garner Nash, DO;  Location: [facility];  Service: Pulmonary;;   COLONOSCOPY W/ POLYPECTOMY     EYE SURGERY     bilateral cataract removal   FIDUCIAL MARKER  PLACEMENT  07/31/2021   Procedure: FIDUCIAL MARKER PLACEMENT;  Surgeon: Garner Nash, DO;  Location: [facility];  Service: Pulmonary;;   POSTERIOR LUMBAR FUSION 4 LEVEL N/A 08/03/2014   Procedure: THORACIC TEN-ELEVEN,THORACIC ELEVEN-TWELVE,THORACIC TWELVE-LUMBAR ONE,LUMBARTWO-THREE,LUMBAR THREE-FOUR POSTERIOR LATERAL FUSION;  Surgeon: Elaina Hoops, MD;  Location: [facility];  Service: Neurosurgery;  Laterality: N/A;  Site includes thoracic and lumbar spine   VIDEO BRONCHOSCOPY WITH ENDOBRONCHIAL NAVIGATION N/A 07/31/2021    Procedure: VIDEO BRONCHOSCOPY WITH ENDOBRONCHIAL NAVIGATION;  Surgeon: Garner Nash, DO;  Location: [place];  Service: Pulmonary;  Laterality: N/A;  ION   VIDEO BRONCHOSCOPY WITH RADIAL ENDOBRONCHIAL ULTRASOUND  07/31/2021   Procedure: RADIAL ENDOBRONCHIAL ULTRASOUND;  Surgeon: Garner Nash, DO;  Location: [facility];  Service: Pulmonary;;    FAMILY HISTORY: History reviewed. No pertinent family history.  SOCIAL HISTORY: Social History   Socioeconomic History   Marital status: Widowed    Spouse name: Not on file   Number of children: Not on file   Years of education: Not on file   Highest education level: Not on file  Occupational History   Not on file  Tobacco Use   Smoking status: Former    Packs/day: 0.50    Years: 48.00    Pack years: 24.00    Types: Cigarettes    Quit date: 06/03/2021    Years since quitting: 0.5   Smokeless tobacco: Never  Vaping Use   Vaping Use: Never used  Substance and Sexual Activity   Alcohol use: Yes    Comment: drinks wine daily & on weekends   Drug use: No   Sexual activity: Not Currently    Birth control/protection: Surgical    Comment: Hysterectomy  Other Topics Concern   Not on file  Social History Narrative   Not on file   Social Determinants of Health   Financial Resource Strain: Not on file  Food Insecurity: Not on file  Transportation Needs: Not on file  Physical Activity: Not on file  Stress: Not on file  Social Connections: Not on file  Intimate Partner Violence: Not on file    PHYSICAL EXAM  GENERAL EXAM/CONSTITUTIONAL: Vitals:  Vitals:   01/01/22 1317  BP: (!) 180/75  Pulse: 86  Weight: 106 lb 8 oz (48.3 kg)  Height: 5\' 2"  (1.575 m)   Body mass index is 19.48 kg/m. Wt Readings from Last 3 Encounters:  01/01/22 106 lb 8 oz (48.3 kg)  12/17/21 105 lb 6.4 oz (47.8 kg)  10/25/21 100 lb 9.6 oz (45.6 kg)   Patient is in no distress; well developed, nourished and groomed; neck is  supple  CARDIOVASCULAR: Examination of carotid arteries is normal; no carotid bruits Regular rate and rhythm, no murmurs Examination of peripheral vascular system by observation and palpation is normal  EYES: Pupils round and reactive to light, Visual fields full to confrontation, Extraocular movements intacts,   MUSCULOSKELETAL: Gait, strength, tone, movements noted in Neurologic exam below  NEUROLOGIC: MENTAL STATUS:  No flowsheet data found. awake, alert, oriented to person, place and time recent and remote memory intact normal attention and concentration language fluent, comprehension intact, naming intact, there is mild to moderate dysarthria.  fund of knowledge appropriate  CRANIAL NERVE:  2nd, 3rd, 4th, 6th - pupils equal and reactive to light, visual fields full to confrontation, extraocular muscles intact, no nystagmus 5th - facial sensation symmetric 7th - facial strength symmetric 8th - hearing intact 9th - palate elevates symmetrically, uvula midline 11th -  shoulder shrug symmetric 12th - tongue protrusion midline  MOTOR:  normal bulk and tone, full strength in the BUE, BLE  SENSORY:  normal and symmetric to light touch, pinprick, temperature, vibration  COORDINATION:  finger-nose-finger, fine finger movements normal  REFLEXES:  deep tendon reflexes present and symmetric  GAIT/STATION:  normal     DIAGNOSTIC DATA (LABS, IMAGING, TESTING) - I reviewed patient records, labs, notes, testing and imaging myself where available.  Lab Results  Component Value Date   WBC 4.5 12/17/2021   HGB 12.1 12/17/2021   HCT 34.4 (L) 12/17/2021   MCV 97.5 12/17/2021   PLT 196 12/17/2021      Component Value Date/Time   NA 136 12/17/2021 1008   K 4.5 12/17/2021 1008   CL 102 12/17/2021 1008   CO2 25 12/17/2021 1008   GLUCOSE 83 12/17/2021 1008   BUN 11 12/17/2021 1008   CREATININE 0.63 12/17/2021 1008   CALCIUM 9.5 12/17/2021 1008   PROT 7.5 12/17/2021  1008   ALBUMIN 4.5 12/17/2021 1008   AST 20 12/17/2021 1008   ALT 14 12/17/2021 1008   ALKPHOS 47 12/17/2021 1008   BILITOT 0.5 12/17/2021 1008   GFRNONAA >60 12/17/2021 1008   GFRAA >90 07/27/2014 1033   No results found for: CHOL, HDL, LDLCALC, LDLDIRECT, TRIG, CHOLHDL No results found for: HGBA1C No results found for: VITAMINB12 No results found for: TSH  MRI Brain with and without contrast 08/18/2021 Negative for metastatic disease to the brain.    ASSESSMENT AND PLAN  76 y.o. year old female with history of lung cancer, COPD, chronic lumbar disease status post surgery who is presenting for dysarthria for the past 4 to 79-months after undergoing radiation therapy for right lung cancer.  Patient reports that her voice was normal prior to starting therapy but during her radiation therapy she started experiencing hoarseness and dysarthria.  She was told by her lung doctor that hoarseness is expected during radiation therapy but her symptoms should improve.  Patient reported her symptoms are not improving and her son believes it is actually getting worse.  She denies any trouble eating, no trouble drinking, denies any pain, denies any mass or lump on her neck but that she noted that her right thyroid is slightly enlarged.  On exam she is able to name, repeat, comprehend and follow directions.  There is no evidence of aphasia but there is evidence of dysarthria mild to moderate.  Her brain MRI is negative for any acute stroke or abnormality.  At this time I will refer her to speech therapy for evaluation.  I also advised patient to follow-up with her primary care doctor for referral to ENT if her symptoms or not improve.  Return if worse or any other concern.   1. Dysarthria   2. Malignant neoplasm of upper lobe of right lung [facility])     Patient Instructions  Referral to speech therapy  Follow up with your primary care doctor, consider ENT referral if symptoms not improved or getting worse   Return if worse   Orders Placed This Encounter  Procedures   Ambulatory referral to Speech Therapy    No orders of the defined types were placed in this encounter.   Return if symptoms worsen or fail to improve.  I have spent a total of 50 minutes dedicated to this patient today, preparing to see patient, examining the patient, ordering tests and/or medications, and counseling the patient including review of tests; performing a medically appropriate  examination and evaluation; ordering medication, test, and procedures; counseling and educating the patient/family/caregiver; referring; independent independently interpreting result and communicating results to the family/patient/caregiver; and documenting clinical information in the electronic medical record.   Alric Ran, MD 01/01/2022, 1:57 PM  [facility] [address] [place],  [address] [phone]

## 2022-01-02 ENCOUNTER — Telehealth: Payer: Self-pay | Admitting: Neurology

## 2022-01-02 NOTE — Telephone Encounter (Signed)
Referral sent to [place] location [phone].

## 2022-01-03 ENCOUNTER — Ambulatory Visit
Admission: RE | Admit: 2022-01-03 | Discharge: 2022-01-03 | Disposition: A | Discharge status: Home / Self Care | Payer: Medicare Other | Source: Ambulatory Visit | Attending: Family Medicine | Admitting: Family Medicine

## 2022-01-03 DIAGNOSIS — Z1231 Encounter for screening mammogram for malignant neoplasm of breast: Secondary | ICD-10-CM | POA: Diagnosis not present

## 2022-02-06 DIAGNOSIS — R479 Unspecified speech disturbances: Secondary | ICD-10-CM | POA: Diagnosis not present

## 2022-02-06 DIAGNOSIS — R490 Dysphonia: Secondary | ICD-10-CM | POA: Diagnosis not present

## 2022-02-18 DIAGNOSIS — H26493 Other secondary cataract, bilateral: Secondary | ICD-10-CM | POA: Diagnosis not present

## 2022-02-18 DIAGNOSIS — D3131 Benign neoplasm of right choroid: Secondary | ICD-10-CM | POA: Diagnosis not present

## 2022-02-18 DIAGNOSIS — H401121 Primary open-angle glaucoma, left eye, mild stage: Secondary | ICD-10-CM | POA: Diagnosis not present

## 2022-02-18 DIAGNOSIS — H40021 Open angle with borderline findings, high risk, right eye: Secondary | ICD-10-CM | POA: Diagnosis not present

## 2022-02-20 DIAGNOSIS — H26492 Other secondary cataract, left eye: Secondary | ICD-10-CM | POA: Diagnosis not present

## 2022-02-20 DIAGNOSIS — Z961 Presence of intraocular lens: Secondary | ICD-10-CM | POA: Diagnosis not present

## 2022-02-20 DIAGNOSIS — R479 Unspecified speech disturbances: Secondary | ICD-10-CM | POA: Diagnosis not present

## 2022-02-20 DIAGNOSIS — R490 Dysphonia: Secondary | ICD-10-CM | POA: Diagnosis not present

## 2022-02-27 DIAGNOSIS — R490 Dysphonia: Secondary | ICD-10-CM | POA: Diagnosis not present

## 2022-02-27 DIAGNOSIS — R479 Unspecified speech disturbances: Secondary | ICD-10-CM | POA: Diagnosis not present

## 2022-02-28 ENCOUNTER — Telehealth: Payer: Self-pay | Admitting: Neurology

## 2022-02-28 NOTE — Telephone Encounter (Signed)
[facility]) would like a call from the nurse to discuss underlining neurological issues causing speech deficient   .  ?

## 2022-02-28 NOTE — Telephone Encounter (Addendum)
I returned the call to [facility], speech therapist. After her initial evaluation, she has some neurological concerns and would like to speak to Dr. April Manson. She can be reached on her cell phone [phone]). She works w/ a Brewing technologist on Fridays. Says if she is unable to answer then she will call right back.  ?

## 2022-02-28 NOTE — Telephone Encounter (Signed)
Attempted to call [facility] @ [facility]. Call went to VM, asked for return call. ?

## 2022-03-06 DIAGNOSIS — R490 Dysphonia: Secondary | ICD-10-CM | POA: Diagnosis not present

## 2022-03-06 DIAGNOSIS — R479 Unspecified speech disturbances: Secondary | ICD-10-CM | POA: Diagnosis not present

## 2022-03-13 DIAGNOSIS — R479 Unspecified speech disturbances: Secondary | ICD-10-CM | POA: Diagnosis not present

## 2022-03-13 DIAGNOSIS — R490 Dysphonia: Secondary | ICD-10-CM | POA: Diagnosis not present

## 2022-03-20 DIAGNOSIS — R490 Dysphonia: Secondary | ICD-10-CM | POA: Diagnosis not present

## 2022-03-20 DIAGNOSIS — R479 Unspecified speech disturbances: Secondary | ICD-10-CM | POA: Diagnosis not present

## 2022-03-27 ENCOUNTER — Telehealth: Payer: Self-pay | Admitting: Neurology

## 2022-03-27 ENCOUNTER — Encounter: Payer: Self-pay | Admitting: Neurology

## 2022-03-27 DIAGNOSIS — R479 Unspecified speech disturbances: Secondary | ICD-10-CM | POA: Diagnosis not present

## 2022-03-27 DIAGNOSIS — R490 Dysphonia: Secondary | ICD-10-CM | POA: Diagnosis not present

## 2022-03-27 NOTE — Telephone Encounter (Signed)
Called pt to schedule a Dysarthria f/u appt per request by provider. Please schedule pt for next available with MD. ?

## 2022-03-27 NOTE — Telephone Encounter (Signed)
Will scheduled a follow up appointment  ?

## 2022-03-27 NOTE — Telephone Encounter (Signed)
Schedule pt f/u appt with Dr. April Manson on 05/07/22 at 2:45p ?

## 2022-04-03 DIAGNOSIS — R479 Unspecified speech disturbances: Secondary | ICD-10-CM | POA: Diagnosis not present

## 2022-04-03 DIAGNOSIS — R490 Dysphonia: Secondary | ICD-10-CM | POA: Diagnosis not present

## 2022-04-10 DIAGNOSIS — R479 Unspecified speech disturbances: Secondary | ICD-10-CM | POA: Diagnosis not present

## 2022-04-10 DIAGNOSIS — R490 Dysphonia: Secondary | ICD-10-CM | POA: Diagnosis not present

## 2022-04-17 ENCOUNTER — Encounter: Payer: Self-pay | Admitting: Neurology

## 2022-04-17 ENCOUNTER — Ambulatory Visit: Payer: Medicare Other | Admitting: Neurology

## 2022-04-17 VITALS — BP 183/82 | HR 83 | Ht 63.0 in | Wt 104.5 lb

## 2022-04-17 DIAGNOSIS — R471 Dysarthria and anarthria: Secondary | ICD-10-CM

## 2022-04-17 DIAGNOSIS — G20 Parkinson's disease: Secondary | ICD-10-CM | POA: Diagnosis not present

## 2022-04-17 DIAGNOSIS — G20C Parkinsonism, unspecified: Secondary | ICD-10-CM

## 2022-04-17 MED ORDER — ALPRAZOLAM 1 MG PO TABS: 1.0000 mg | ORAL_TABLET | ORAL | 0 refills | Status: DC | PRN

## 2022-04-17 MED ORDER — CARBIDOPA-LEVODOPA 25-100 MG PO TABS
1.0000 | ORAL_TABLET | Freq: Three times a day (TID) | ORAL | 0 refills | Status: DC
Start: 2022-04-17 — End: 2022-05-27

## 2022-04-17 NOTE — Patient Instructions (Signed)
Trial of Sinemet DaTscan Follow-up in 4 weeks for reevaluation, if no improvement then will look into motor neuron disease work-up

## 2022-04-17 NOTE — Progress Notes (Signed)
[facility]  PATIENT: Virginia Rose DOB: 08/08/1946  REQUESTING CLINICIAN: Chipper Herb Family M* HISTORY FROM: Patient  REASON FOR VISIT: Dysarthria    HISTORICAL  CHIEF COMPLAINT:  Chief Complaint  Patient presents with   Follow-up    Rm 12. Accompanied by Rolene Arbour. Dysarthria f/u. Would like to discuss speech therapy.   INTERVAL HISTORY 04/17/22:  Patient presents today for follow-up, she is accompanied by her friend and neighbor Fleischmanns.  Since last visit she has started speech therapy, reported speech therapy has helped her a lot.  She still feels like her voice is stronger.  She still having slurred speech, slowness of speech and also making paraphasic errors.  She denies any overall weakness, stated that she may she walks a mile a day and no recent falls.     HISTORY OF PRESENT ILLNESS:  This is a 76 year old woman past medical history of lung cancer treated with radiation therapy, lumbar disc disease status post surgery and COPD who is presenting with speech difficulty.  Patient describes speech difficulty has hesitancy, reports her voice is comes and goes, and this started after she started radiation therapy. She reported her last session was in November 2022.  Denies any problems with eating, no trouble drinking, denies choking with liquid or solid and denies neck pain.  She was told she may experience hoarseness of voice after radiation therapy but her symptoms are not improving.  During this time her pulmonologist had completed a brain MRI with and without contrast which was negative for metastasis and negative for any acute stroke    OTHER MEDICAL CONDITIONS: Lung cancer, lumbar disc disease, COPD    REVIEW OF SYSTEMS: Full 14 system review of systems performed and negative with exception of: as noted in the HPI   ALLERGIES: Allergies  Allergen Reactions   Latex     Band-aids, pull off skin    HOME MEDICATIONS: Outpatient  Medications Prior to Visit  Medication Sig Dispense Refill   brimonidine (ALPHAGAN) 0.2 % ophthalmic solution Place 1 drop into both eyes 2 (two) times daily.     CALCIUM-VITAMIN D PO Take 2 tablets by mouth daily.     denosumab (PROLIA) 60 MG/ML SOSY injection Inject 60 mg into the skin every 6 (six) months.     dorzolamide (TRUSOPT) 2 % ophthalmic solution Place 1 drop into both eyes 2 (two) times daily.     No facility-administered medications prior to visit.    PAST MEDICAL HISTORY: Past Medical History:  Diagnosis Date   Arthritis    Complication of anesthesia    COPD (chronic obstructive pulmonary disease) ([place])    History of blood transfusion    with back surgery procedure   History of radiation therapy    right lung 09/10/2021, 09/12/2021, 09/17/2021  Dr Gery Pray   Hypercholesteremia    Memory loss    Osteoporosis    Shortness of breath    with exertion, no oxygen   Smoker    quit 05/14/21  - 1ppd - pt started smoking at the age 35yrs old    PAST SURGICAL HISTORY: Past Surgical History:  Procedure Laterality Date   ABDOMINAL HYSTERECTOMY     APPENDECTOMY     BRONCHIAL BIOPSY  07/31/2021   Procedure: BRONCHIAL BIOPSIES;  Surgeon: Garner Nash, DO;  Location: [place] [facility];  Service: Pulmonary;;   BRONCHIAL BRUSHINGS  07/31/2021   Procedure: BRONCHIAL BRUSHINGS;  Surgeon: Garner Nash, DO;  Location: [place];  Service: Pulmonary;;   BRONCHIAL NEEDLE ASPIRATION BIOPSY  07/31/2021   Procedure: BRONCHIAL NEEDLE ASPIRATION BIOPSIES;  Surgeon: Garner Nash, DO;  Location: [place] [facility];  Service: Pulmonary;;   BRONCHIAL WASHINGS  07/31/2021   Procedure: BRONCHIAL WASHINGS;  Surgeon: Garner Nash, DO;  Location: [place] [facility];  Service: Pulmonary;;   COLONOSCOPY W/ POLYPECTOMY     EYE SURGERY     bilateral cataract removal   FIDUCIAL MARKER PLACEMENT  07/31/2021   Procedure: FIDUCIAL MARKER PLACEMENT;  Surgeon: Garner Nash, DO;  Location: [place] [facility];   Service: Pulmonary;;   POSTERIOR LUMBAR FUSION 4 LEVEL N/A 08/03/2014   Procedure: THORACIC TEN-ELEVEN,THORACIC ELEVEN-TWELVE,THORACIC TWELVE-LUMBAR ONE,LUMBARTWO-THREE,LUMBAR THREE-FOUR POSTERIOR LATERAL FUSION;  Surgeon: Elaina Hoops, MD;  Location: [place] [facility];  Service: Neurosurgery;  Laterality: N/A;  Site includes thoracic and lumbar spine   VIDEO BRONCHOSCOPY WITH ENDOBRONCHIAL NAVIGATION N/A 07/31/2021   Procedure: VIDEO BRONCHOSCOPY WITH ENDOBRONCHIAL NAVIGATION;  Surgeon: Garner Nash, DO;  Location: [place];  Service: Pulmonary;  Laterality: N/A;  ION   VIDEO BRONCHOSCOPY WITH RADIAL ENDOBRONCHIAL ULTRASOUND  07/31/2021   Procedure: RADIAL ENDOBRONCHIAL ULTRASOUND;  Surgeon: Garner Nash, DO;  Location: [place] [facility];  Service: Pulmonary;;    FAMILY HISTORY: History reviewed. No pertinent family history.  SOCIAL HISTORY: Social History   Socioeconomic History   Marital status: Widowed    Spouse name: Not on file   Number of children: Not on file   Years of education: Not on file   Highest education level: Not on file  Occupational History   Not on file  Tobacco Use   Smoking status: Former    Packs/day: 0.50    Years: 48.00    Pack years: 24.00    Types: Cigarettes    Quit date: 06/03/2021    Years since quitting: 0.8   Smokeless tobacco: Never  Vaping Use   Vaping Use: Never used  Substance and Sexual Activity   Alcohol use: Yes    Comment: drinks wine daily & on weekends   Drug use: No   Sexual activity: Not Currently    Birth control/protection: Surgical    Comment: Hysterectomy  Other Topics Concern   Not on file  Social History Narrative   Not on file   Social Determinants of Health   Financial Resource Strain: Not on file  Food Insecurity: Not on file  Transportation Needs: Not on file  Physical Activity: Not on file  Stress: Not on file  Social Connections: Not on file  Intimate Partner Violence: Not on file    PHYSICAL  EXAM  GENERAL EXAM/CONSTITUTIONAL: Vitals:  Vitals:   04/17/22 1106  BP: (!) 183/82  Pulse: 83  Weight: 104 lb 8 oz (47.4 kg)  Height: 5\' 3"  (1.6 m)   Body mass index is 18.51 kg/m. Wt Readings from Last 3 Encounters:  04/17/22 104 lb 8 oz (47.4 kg)  01/01/22 106 lb 8 oz (48.3 kg)  12/17/21 105 lb 6.4 oz (47.8 kg)   Patient is in no distress; well developed, nourished and groomed; neck is supple  EYES: Pupils round and reactive to light, Visual fields full to confrontation, Extraocular movements intacts,   MUSCULOSKELETAL: Gait, strength, tone, movements noted in Neurologic exam below  NEUROLOGIC: MENTAL STATUS:      View : No data to display.         awake, alert, oriented to person, place and time recent and remote memory intact normal attention and concentration language fluent, comprehension  intact, naming intact, there is mild to moderate dysarthria.  fund of knowledge appropriate  CRANIAL NERVE:  2nd, 3rd, 4th, 6th - pupils equal and reactive to light, visual fields full to confrontation, extraocular muscles intact, no nystagmus 5th - facial sensation symmetric 7th - facial strength symmetric 8th - hearing intact 9th - palate elevates symmetrically, uvula midline 11th - shoulder shrug symmetric 12th - tongue protrusion midline  MOTOR:  normal bulk and tone, full strength in the BUE, BLE. There is bradykinesia and increase rigidity on the left when compared to the right.   SENSORY:  normal and symmetric to light touch   COORDINATION:  finger-nose-finger, Difficulty with fine finger movements, mostly on the left   REFLEXES:  deep tendon reflexes present and symmetric  GAIT/STATION:  normal   DIAGNOSTIC DATA (LABS, IMAGING, TESTING) - I reviewed patient records, labs, notes, testing and imaging myself where available.  Lab Results  Component Value Date   WBC 4.5 12/17/2021   HGB 12.1 12/17/2021   HCT 34.4 (L) 12/17/2021   MCV 97.5 12/17/2021    PLT 196 12/17/2021      Component Value Date/Time   NA 136 12/17/2021 1008   K 4.5 12/17/2021 1008   CL 102 12/17/2021 1008   CO2 25 12/17/2021 1008   GLUCOSE 83 12/17/2021 1008   BUN 11 12/17/2021 1008   CREATININE 0.63 12/17/2021 1008   CALCIUM 9.5 12/17/2021 1008   PROT 7.5 12/17/2021 1008   ALBUMIN 4.5 12/17/2021 1008   AST 20 12/17/2021 1008   ALT 14 12/17/2021 1008   ALKPHOS 47 12/17/2021 1008   BILITOT 0.5 12/17/2021 1008   GFRNONAA >60 12/17/2021 1008   GFRAA >90 07/27/2014 1033   No results found for: CHOL, HDL, LDLCALC, LDLDIRECT, TRIG, CHOLHDL No results found for: HGBA1C No results found for: VITAMINB12 No results found for: TSH  MRI Brain with and without contrast 08/18/2021 Negative for metastatic disease to the brain.   ASSESSMENT AND PLAN  76 y.o. year old female with history of lung cancer, COPD, chronic lumbar disease status post surgery who is presenting for dysarthria follow up.  Patient continued to have slowness of speech, on exam there were no fasciculation, there were no frank weakness but she did have bradykinesia and increased rigidity on the left upper extremity and lower extremity.  She does have signs of parkinsonism even though is very mild.  I will proceed with a trial of Sinemet to see if the bradykinesia and speech is speech will improve.  I will also proceed with DaTscan.  I will see her in 4 weeks for follow-up if no improvement after starting the Sinemet they will look into motor neuro disease workup    1. Dysarthria   2. Parkinsonism, unspecified Parkinsonism type [facility])     Patient Instructions  Trial of Sinemet DaTscan Follow-up in 4 weeks for reevaluation, if no improvement then will look into motor neuron disease work-up  No orders of the defined types were placed in this encounter.   Meds ordered this encounter  Medications   carbidopa-levodopa (SINEMET IR) 25-100 MG tablet    Sig: Take 1 tablet by mouth 3 (three) times  daily.    Dispense:  90 tablet    Refill:  0   ALPRAZolam (XANAX) 1 MG tablet    Sig: Take 1 tablet (1 mg total) by mouth as needed for anxiety (To take prior to Scan).    Dispense:  2 tablet    Refill:  0    Return in about 4 weeks (around 05/15/2022).  I have spent a total of 55 minutes dedicated to this patient today, preparing to see patient, performing a medically appropriate examination and evaluation, ordering tests and/or medications and procedures, and counseling and educating the patient/family/caregiver; independently interpreting result and communicating results to the family/patient/caregiver; and documenting clinical information in the electronic medical record.    Alric Ran, MD 04/17/2022, 3:41 PM  [facility] [address] [place], [place] [address] [phone]

## 2022-04-24 ENCOUNTER — Telehealth: Payer: Self-pay | Admitting: Neurology

## 2022-04-24 NOTE — Telephone Encounter (Signed)
[employer] medicare Josem Kaufmann: [phone] exp. 04/24/22-05/23/22 sent to [facility] nuclear medicine

## 2022-05-02 ENCOUNTER — Encounter (HOSPITAL_COMMUNITY)
Admission: RE | Admit: 2022-05-02 | Discharge: 2022-05-02 | Disposition: A | Discharge status: Home / Self Care | Payer: Medicare Other | Source: Ambulatory Visit | Attending: Neurology | Admitting: Neurology

## 2022-05-02 DIAGNOSIS — G20 Parkinson's disease: Secondary | ICD-10-CM | POA: Insufficient documentation

## 2022-05-02 DIAGNOSIS — R4781 Slurred speech: Secondary | ICD-10-CM | POA: Diagnosis not present

## 2022-05-02 MED ORDER — IOFLUPANE I 123 185 MBQ/2.5ML IV SOLN
4.5000 | Freq: Once | INTRAVENOUS | Status: AC | PRN
  Administered 2022-05-02: 4.5 via INTRAVENOUS
  Filled 2022-05-02: qty 5

## 2022-05-02 MED ORDER — POTASSIUM IODIDE (ANTIDOTE) 130 MG PO TABS: 130.0000 mg | ORAL_TABLET | Freq: Once | ORAL | Status: DC

## 2022-05-02 MED ORDER — POTASSIUM IODIDE (ANTIDOTE) 130 MG PO TABS
ORAL_TABLET | ORAL | Status: AC
  Filled 2022-05-02: qty 1

## 2022-05-07 ENCOUNTER — Ambulatory Visit: Payer: Medicare Other | Admitting: Neurology

## 2022-05-08 DIAGNOSIS — M810 Age-related osteoporosis without current pathological fracture: Secondary | ICD-10-CM | POA: Diagnosis not present

## 2022-05-27 ENCOUNTER — Ambulatory Visit: Payer: Medicare Other | Admitting: Neurology

## 2022-05-27 ENCOUNTER — Encounter: Payer: Self-pay | Admitting: Neurology

## 2022-05-27 VITALS — BP 173/76 | HR 91 | Ht 63.0 in | Wt 104.0 lb

## 2022-05-27 DIAGNOSIS — C3411 Malignant neoplasm of upper lobe, right bronchus or lung: Secondary | ICD-10-CM

## 2022-05-27 DIAGNOSIS — R471 Dysarthria and anarthria: Secondary | ICD-10-CM | POA: Diagnosis not present

## 2022-05-27 NOTE — Patient Instructions (Signed)
Continue with speech therapy Continue with current medications I have also recommended patient to seek second opinion at the tertiary center such as [facility] or [facility] in their neuromuscular department,  but she would like to defer at the moment.  Follow-up in 3 months

## 2022-05-27 NOTE — Progress Notes (Signed)
[facility]  PATIENT: Virginia Rose DOB: [birth year]  REQUESTING CLINICIAN: Chipper Herb Family M* HISTORY FROM: Patient  REASON FOR VISIT: Dysarthria    HISTORICAL  CHIEF COMPLAINT:  Chief Complaint  Patient presents with   Follow-up    Rm 12, with son Her to discuss results, states Sinemet was not helpful stopped taking 2 weeks ago    INTERVAL HISTORY 05/27/22:  Patient presents today for follow-up, she is accompanied by her son.  She reports since last visit in June her symptoms have been the same.  Sinemet has not been helpful, therefore she discontinue it 2 weeks ago.  She completed the DaTscan which was negative for Parkinson disease.  She still getting speech therapy once a month.    INTERVAL HISTORY 04/17/22:  Patient presents today for follow-up, she is accompanied by her friend and neighbor [place].  Since last visit she has started speech therapy, reported speech therapy has helped her a lot.  She still feels like her voice is stronger.  She still having slurred speech, slowness of speech and also making paraphasic errors.  She denies any overall weakness, stated that she may she walks a mile a day and no recent falls.     HISTORY OF PRESENT ILLNESS:  This is a 76 year old woman past medical history of lung cancer treated with radiation therapy, lumbar disc disease status post surgery and COPD who is presenting with speech difficulty.  Patient describes speech difficulty has hesitancy, reports her voice is comes and goes, and this started after she started radiation therapy. She reported her last session was in November 2022.  Denies any problems with eating, no trouble drinking, denies choking with liquid or solid and denies neck pain.  She was told she may experience hoarseness of voice after radiation therapy but her symptoms are not improving.  During this time her pulmonologist had completed a brain MRI with and without contrast which was negative  for metastasis and negative for any acute stroke    OTHER MEDICAL CONDITIONS: Lung cancer, lumbar disc disease, COPD    REVIEW OF SYSTEMS: Full 14 system review of systems performed and negative with exception of: as noted in the HPI   ALLERGIES: Allergies  Allergen Reactions   Latex     Band-aids, pull off skin    HOME MEDICATIONS: Outpatient Medications Prior to Visit  Medication Sig Dispense Refill   ALPRAZolam (XANAX) 1 MG tablet Take 1 tablet (1 mg total) by mouth as needed for anxiety (To take prior to Scan). 2 tablet 0   brimonidine (ALPHAGAN) 0.2 % ophthalmic solution Place 1 drop into both eyes 2 (two) times daily.     CALCIUM-VITAMIN D PO Take 2 tablets by mouth daily.     dorzolamide (TRUSOPT) 2 % ophthalmic solution Place 1 drop into both eyes 2 (two) times daily.     carbidopa-levodopa (SINEMET IR) 25-100 MG tablet Take 1 tablet by mouth 3 (three) times daily. 90 tablet 0   denosumab (PROLIA) 60 MG/ML SOSY injection Inject 60 mg into the skin every 6 (six) months.     No facility-administered medications prior to visit.    PAST MEDICAL HISTORY: Past Medical History:  Diagnosis Date   Arthritis    Complication of anesthesia    COPD (chronic obstructive pulmonary disease) ([place])    History of blood transfusion    with back surgery procedure   History of radiation therapy    right lung 09/10/2021, 09/12/2021, 09/17/2021  Dr  Gery Pray   Hypercholesteremia    Memory loss    Osteoporosis    Shortness of breath    with exertion, no oxygen   Smoker    quit 05/14/21  - 1ppd - pt started smoking at the age 39yrs old    PAST SURGICAL HISTORY: Past Surgical History:  Procedure Laterality Date   ABDOMINAL HYSTERECTOMY     APPENDECTOMY     BRONCHIAL BIOPSY  07/31/2021   Procedure: BRONCHIAL BIOPSIES;  Surgeon: Garner Nash, DO;  Location: [place] [facility];  Service: Pulmonary;;   BRONCHIAL BRUSHINGS  07/31/2021   Procedure: BRONCHIAL BRUSHINGS;  Surgeon: Garner Nash, DO;  Location: [place];  Service: Pulmonary;;   BRONCHIAL NEEDLE ASPIRATION BIOPSY  07/31/2021   Procedure: BRONCHIAL NEEDLE ASPIRATION BIOPSIES;  Surgeon: Garner Nash, DO;  Location: [place];  Service: Pulmonary;;   BRONCHIAL WASHINGS  07/31/2021   Procedure: BRONCHIAL WASHINGS;  Surgeon: Garner Nash, DO;  Location: [place] [facility];  Service: Pulmonary;;   COLONOSCOPY W/ POLYPECTOMY     EYE SURGERY     bilateral cataract removal   FIDUCIAL MARKER PLACEMENT  07/31/2021   Procedure: FIDUCIAL MARKER PLACEMENT;  Surgeon: Garner Nash, DO;  Location: [place] [facility];  Service: Pulmonary;;   POSTERIOR LUMBAR FUSION 4 LEVEL N/A 08/03/2014   Procedure: THORACIC TEN-ELEVEN,THORACIC ELEVEN-TWELVE,THORACIC TWELVE-LUMBAR ONE,LUMBARTWO-THREE,LUMBAR THREE-FOUR POSTERIOR LATERAL FUSION;  Surgeon: Elaina Hoops, MD;  Location: [facility];  Service: Neurosurgery;  Laterality: N/A;  Site includes thoracic and lumbar spine   VIDEO BRONCHOSCOPY WITH ENDOBRONCHIAL NAVIGATION N/A 07/31/2021   Procedure: VIDEO BRONCHOSCOPY WITH ENDOBRONCHIAL NAVIGATION;  Surgeon: Garner Nash, DO;  Location: [place];  Service: Pulmonary;  Laterality: N/A;  ION   VIDEO BRONCHOSCOPY WITH RADIAL ENDOBRONCHIAL ULTRASOUND  07/31/2021   Procedure: RADIAL ENDOBRONCHIAL ULTRASOUND;  Surgeon: Garner Nash, DO;  Location: [place] [facility];  Service: Pulmonary;;    FAMILY HISTORY: History reviewed. No pertinent family history.  SOCIAL HISTORY: Social History   Socioeconomic History   Marital status: Widowed    Spouse name: Not on file   Number of children: Not on file   Years of education: Not on file   Highest education level: Not on file  Occupational History   Not on file  Tobacco Use   Smoking status: Former    Packs/day: 0.50    Years: 48.00    Total pack years: 24.00    Types: Cigarettes    Quit date: 06/03/2021    Years since quitting: 0.9   Smokeless tobacco: Never  Vaping Use   Vaping  Use: Never used  Substance and Sexual Activity   Alcohol use: Yes    Comment: drinks wine daily & on weekends   Drug use: No   Sexual activity: Not Currently    Birth control/protection: Surgical    Comment: Hysterectomy  Other Topics Concern   Not on file  Social History Narrative   Not on file   Social Determinants of Health   Financial Resource Strain: Not on file  Food Insecurity: Not on file  Transportation Needs: Not on file  Physical Activity: Not on file  Stress: Not on file  Social Connections: Not on file  Intimate Partner Violence: Not on file    PHYSICAL EXAM  GENERAL EXAM/CONSTITUTIONAL: Vitals:  Vitals:   05/27/22 1131  BP: (!) 173/76  Pulse: 91  Weight: 104 lb (47.2 kg)  Height: 5\' 3"  (1.6 m)    Body mass index is 18.42  kg/m. Wt Readings from Last 3 Encounters:  05/27/22 104 lb (47.2 kg)  04/17/22 104 lb 8 oz (47.4 kg)  01/01/22 106 lb 8 oz (48.3 kg)   Patient is in no distress; well developed, nourished and groomed; neck is supple  EYES: Pupils round and reactive to light, Visual fields full to confrontation, Extraocular movements intacts,   MUSCULOSKELETAL: Gait, strength, tone, movements noted in Neurologic exam below  NEUROLOGIC: MENTAL STATUS:      No data to display         awake, alert, oriented to person, place and time recent and remote memory intact normal attention and concentration language fluent, comprehension intact, naming intact, there is mild to moderate dysarthria.  fund of knowledge appropriate  CRANIAL NERVE:  2nd, 3rd, 4th, 6th - pupils equal and reactive to light, visual fields full to confrontation, extraocular muscles intact, no nystagmus 5th - facial sensation symmetric 7th - facial strength symmetric 8th - hearing intact 9th - palate elevates symmetrically, uvula midline 11th - shoulder shrug symmetric 12th - tongue protrusion midline  MOTOR:  normal bulk and tone, full strength in the BUE, BLE. There  is bradykinesia and increase rigidity on the left when compared to the right.   SENSORY:  normal and symmetric to light touch   COORDINATION:  finger-nose-finger, Difficulty with fine finger movements, mostly on the left   REFLEXES:  deep tendon reflexes present and symmetric  GAIT/STATION:  normal   DIAGNOSTIC DATA (LABS, IMAGING, TESTING) - I reviewed patient records, labs, notes, testing and imaging myself where available.  Lab Results  Component Value Date   WBC 4.5 12/17/2021   HGB 12.1 12/17/2021   HCT 34.4 (L) 12/17/2021   MCV 97.5 12/17/2021   PLT 196 12/17/2021      Component Value Date/Time   NA 136 12/17/2021 1008   K 4.5 12/17/2021 1008   CL 102 12/17/2021 1008   CO2 25 12/17/2021 1008   GLUCOSE 83 12/17/2021 1008   BUN 11 12/17/2021 1008   CREATININE 0.63 12/17/2021 1008   CALCIUM 9.5 12/17/2021 1008   PROT 7.5 12/17/2021 1008   ALBUMIN 4.5 12/17/2021 1008   AST 20 12/17/2021 1008   ALT 14 12/17/2021 1008   ALKPHOS 47 12/17/2021 1008   BILITOT 0.5 12/17/2021 1008   GFRNONAA >60 12/17/2021 1008   GFRAA >90 07/27/2014 1033   No results found for: "CHOL", "HDL", "LDLCALC", "LDLDIRECT", "TRIG", "CHOLHDL" No results found for: "HGBA1C" No results found for: "VITAMINB12" No results found for: "TSH"  MRI Brain with and without contrast 08/18/2021 Negative for metastatic disease to the brain.   DatScan 05/02/2022 Ioflupane scan within normal limits. No reduced radiotracer activity in basal ganglia to suggest Parkinson's syndrome pathology   ASSESSMENT AND PLAN  76 y.o. year old female with history of lung cancer, COPD, chronic lumbar disease status post surgery who is presenting for dysarthria follow up.  DaTscan has been negative for Parkinson disease, Sinemet has not been helpful.  On exam, she still have mild to moderate dysarthria.  She is able to name, repeat, comprehend, no evidence of aphasia.  At this point work-up has been negative so far.  I  have informed patient the next step will be to continue monitoring her and having low threshold to repeat MRI brain to rule metastatic disease, she does have a history of lung cancer, another option is to seek a second  neurological opinion at [facility] or [facility] in the neuromuscular department and we  also discussed thirdly of the possibility of this being a functional disorder.  She reports the symptoms started at the same time she was diagnosed with lung cancer, at that time she was also under a lot of stress. I will continue to follow up with her, next visit will be in 3 months.     1. Dysarthria   2. Malignant neoplasm of upper lobe of right lung [facility])      Patient Instructions  Continue with speech therapy Continue with current medications I have also recommended patient to seek second opinion at the tertiary center such as [facility] or [facility] in their neuromuscular department,  but she would like to defer at the moment.  Follow-up in 3 months  No orders of the defined types were placed in this encounter.   No orders of the defined types were placed in this encounter.   Return in about 3 months (around 08/27/2022).   I have spent a total of 45 minutes dedicated to this patient today, preparing to see patient, performing a medically appropriate examination and evaluation, ordering tests and/or medications and procedures, and counseling and educating the patient/family/caregiver; independently interpreting result and communicating results to the family/patient/caregiver; and documenting clinical information in the electronic medical record.   Alric Ran, MD 05/27/2022, 4:53 PM  [facility] [address] [place], [place] [address] [phone]

## 2022-05-29 DIAGNOSIS — R479 Unspecified speech disturbances: Secondary | ICD-10-CM | POA: Diagnosis not present

## 2022-05-29 DIAGNOSIS — R490 Dysphonia: Secondary | ICD-10-CM | POA: Diagnosis not present

## 2022-06-14 ENCOUNTER — Other Ambulatory Visit: Payer: Self-pay

## 2022-06-14 ENCOUNTER — Inpatient Hospital Stay: Payer: Medicare Other | Attending: Internal Medicine

## 2022-06-14 ENCOUNTER — Ambulatory Visit (HOSPITAL_COMMUNITY)
Admission: RE | Admit: 2022-06-14 | Discharge: 2022-06-14 | Disposition: A | Discharge status: Home / Self Care | Payer: Medicare Other | Source: Ambulatory Visit | Attending: Internal Medicine | Admitting: Internal Medicine

## 2022-06-14 DIAGNOSIS — C3411 Malignant neoplasm of upper lobe, right bronchus or lung: Secondary | ICD-10-CM | POA: Insufficient documentation

## 2022-06-14 DIAGNOSIS — R911 Solitary pulmonary nodule: Secondary | ICD-10-CM | POA: Diagnosis not present

## 2022-06-14 DIAGNOSIS — I10 Essential (primary) hypertension: Secondary | ICD-10-CM | POA: Insufficient documentation

## 2022-06-14 DIAGNOSIS — C3490 Malignant neoplasm of unspecified part of unspecified bronchus or lung: Secondary | ICD-10-CM | POA: Diagnosis not present

## 2022-06-14 LAB — CBC WITH DIFFERENTIAL (CANCER CENTER ONLY)
Abs Immature Granulocytes: 0.01 10*3/uL (ref 0.00–0.07)
Basophils Absolute: 0 10*3/uL (ref 0.0–0.1)
Basophils Relative: 1 %
Eosinophils Absolute: 0 10*3/uL (ref 0.0–0.5)
Eosinophils Relative: 1 %
HCT: 36.8 % (ref 36.0–46.0)
Hemoglobin: 12.6 g/dL (ref 12.0–15.0)
Immature Granulocytes: 0 %
Lymphocytes Relative: 25 %
Lymphs Abs: 1.4 10*3/uL (ref 0.7–4.0)
MCH: 33.2 pg (ref 26.0–34.0)
MCHC: 34.2 g/dL (ref 30.0–36.0)
MCV: 96.8 fL (ref 80.0–100.0)
Monocytes Absolute: 0.5 10*3/uL (ref 0.1–1.0)
Monocytes Relative: 9 %
Neutro Abs: 3.8 10*3/uL (ref 1.7–7.7)
Neutrophils Relative %: 64 %
Platelet Count: 238 10*3/uL (ref 150–400)
RBC: 3.8 MIL/uL — ABNORMAL LOW (ref 3.87–5.11)
RDW: 13.7 % (ref 11.5–15.5)
WBC Count: 5.9 10*3/uL (ref 4.0–10.5)
nRBC: 0 % (ref 0.0–0.2)

## 2022-06-14 LAB — CMP (CANCER CENTER ONLY)
ALT: 13 U/L (ref 0–44)
AST: 18 U/L (ref 15–41)
Albumin: 4.6 g/dL (ref 3.5–5.0)
Alkaline Phosphatase: 45 U/L (ref 38–126)
Anion gap: 8 (ref 5–15)
BUN: 10 mg/dL (ref 8–23)
CO2: 28 mmol/L (ref 22–32)
Calcium: 9.5 mg/dL (ref 8.9–10.3)
Chloride: 99 mmol/L (ref 98–111)
Creatinine: 0.69 mg/dL (ref 0.44–1.00)
GFR, Estimated: >60 mL/min (ref >60)
Glucose, Bld: 106 mg/dL — ABNORMAL HIGH (ref 70–99)
Potassium: 4.1 mmol/L (ref 3.5–5.1)
Sodium: 135 mmol/L (ref 135–145)
Total Bilirubin: 0.8 mg/dL (ref 0.3–1.2)
Total Protein: 8 g/dL (ref 6.5–8.1)

## 2022-06-14 MED ORDER — SODIUM CHLORIDE (PF) 0.9 % IJ SOLN
INTRAMUSCULAR | Status: AC
  Filled 2022-06-14: qty 50

## 2022-06-14 MED ORDER — IOHEXOL 300 MG/ML  SOLN
75.0000 mL | Freq: Once | INTRAMUSCULAR | Status: AC | PRN
  Administered 2022-06-14: 75 mL via INTRAVENOUS

## 2022-06-18 ENCOUNTER — Other Ambulatory Visit: Payer: Self-pay

## 2022-06-18 ENCOUNTER — Inpatient Hospital Stay: Payer: Medicare Other | Admitting: Internal Medicine

## 2022-06-18 ENCOUNTER — Encounter: Payer: Self-pay | Admitting: Internal Medicine

## 2022-06-18 VITALS — BP 177/73 | HR 74 | Temp 98.1°F | Resp 18 | Ht 63.0 in | Wt 103.5 lb

## 2022-06-18 DIAGNOSIS — C3490 Malignant neoplasm of unspecified part of unspecified bronchus or lung: Secondary | ICD-10-CM

## 2022-06-18 DIAGNOSIS — C3411 Malignant neoplasm of upper lobe, right bronchus or lung: Secondary | ICD-10-CM | POA: Diagnosis not present

## 2022-06-18 DIAGNOSIS — I10 Essential (primary) hypertension: Secondary | ICD-10-CM | POA: Diagnosis not present

## 2022-06-18 NOTE — Progress Notes (Signed)
[place] [phone]   [phone]  OFFICE PROGRESS NOTE  Faustino Congress, NP [place] [address]  DIAGNOSIS: Stage IA (T1b, N0, M0) non-small cell lung cancer presented with right upper lobe lung nodule that biopsy confirmed in September 2022.   PRIOR THERAPY: SBRT to the right upper lobe lung nodule under the care of Dr. Sondra Come completed on September 17, 2021  CURRENT THERAPY: Observation.  INTERVAL HISTORY: Virginia Rose 76 y.o. female returns to the clinic today for 6 months follow-up visit.  The patient is feeling fine today with no concerning complaints except for mild cough and shortness of breath with exertion.  She denied having any chest pain, or hemoptysis.  She denied having any nausea, vomiting, diarrhea or constipation.  She has no headache or visual changes.  She is here today for evaluation with repeat CT scan of the chest for restaging of her disease.  MEDICAL HISTORY: Past Medical History:  Diagnosis Date   Arthritis    Complication of anesthesia    COPD (chronic obstructive pulmonary disease) ([place])    History of blood transfusion    with back surgery procedure   History of radiation therapy    right lung 09/10/2021, 09/12/2021, 09/17/2021  Dr Gery Pray   Hypercholesteremia    Memory loss    Osteoporosis    Shortness of breath    with exertion, no oxygen   Smoker    quit 05/14/21  - 1ppd - pt started smoking at the age 12yrs old    ALLERGIES:  is allergic to latex.  MEDICATIONS:  Current Outpatient Medications  Medication Sig Dispense Refill   brimonidine (ALPHAGAN) 0.2 % ophthalmic solution Place 1 drop into both eyes 2 (two) times daily.     CALCIUM-VITAMIN D PO Take 2 tablets by mouth daily.     dorzolamide (TRUSOPT) 2 % ophthalmic solution Place 1 drop into both eyes 2 (two) times daily.     No current facility-administered medications for this visit.    SURGICAL HISTORY:  Past  Surgical History:  Procedure Laterality Date   ABDOMINAL HYSTERECTOMY     APPENDECTOMY     BRONCHIAL BIOPSY  07/31/2021   Procedure: BRONCHIAL BIOPSIES;  Surgeon: Garner Nash, DO;  Location: [facility];  Service: Pulmonary;;   BRONCHIAL BRUSHINGS  07/31/2021   Procedure: BRONCHIAL BRUSHINGS;  Surgeon: Garner Nash, DO;  Location: [place] [facility];  Service: Pulmonary;;   BRONCHIAL NEEDLE ASPIRATION BIOPSY  07/31/2021   Procedure: BRONCHIAL NEEDLE ASPIRATION BIOPSIES;  Surgeon: Garner Nash, DO;  Location: [facility];  Service: Pulmonary;;   BRONCHIAL WASHINGS  07/31/2021   Procedure: BRONCHIAL WASHINGS;  Surgeon: Garner Nash, DO;  Location: [facility];  Service: Pulmonary;;   COLONOSCOPY W/ POLYPECTOMY     EYE SURGERY     bilateral cataract removal   FIDUCIAL MARKER PLACEMENT  07/31/2021   Procedure: FIDUCIAL MARKER PLACEMENT;  Surgeon: Garner Nash, DO;  Location: [facility];  Service: Pulmonary;;   POSTERIOR LUMBAR FUSION 4 LEVEL N/A 08/03/2014   Procedure: THORACIC TEN-ELEVEN,THORACIC ELEVEN-TWELVE,THORACIC TWELVE-LUMBAR ONE,LUMBARTWO-THREE,LUMBAR THREE-FOUR POSTERIOR LATERAL FUSION;  Surgeon: Elaina Hoops, MD;  Location: [facility];  Service: Neurosurgery;  Laterality: N/A;  Site includes thoracic and lumbar spine   VIDEO BRONCHOSCOPY WITH ENDOBRONCHIAL NAVIGATION N/A 07/31/2021   Procedure: VIDEO BRONCHOSCOPY WITH ENDOBRONCHIAL NAVIGATION;  Surgeon: Garner Nash, DO;  Location: [facility];  Service: Pulmonary;  Laterality: N/A;  ION   VIDEO BRONCHOSCOPY WITH RADIAL ENDOBRONCHIAL ULTRASOUND  07/31/2021   Procedure: RADIAL ENDOBRONCHIAL ULTRASOUND;  Surgeon: Garner Nash, DO;  Location: [facility];  Service: Pulmonary;;    REVIEW OF SYSTEMS:  A comprehensive review of systems was negative except for: Respiratory: positive for cough and dyspnea on exertion   PHYSICAL EXAMINATION: General appearance: alert, cooperative, and no distress Head: Normocephalic,  without obvious abnormality, atraumatic Neck: no adenopathy, no JVD, supple, symmetrical, trachea midline, and thyroid not enlarged, symmetric, no tenderness/mass/nodules Lymph nodes: Cervical, supraclavicular, and axillary nodes normal. Resp: clear to auscultation bilaterally Back: symmetric, no curvature. ROM normal. No CVA tenderness. Cardio: regular rate and rhythm, S1, S2 normal, no murmur, click, rub or gallop GI: soft, non-tender; bowel sounds normal; no masses,  no organomegaly Extremities: extremities normal, atraumatic, no cyanosis or edema  ECOG PERFORMANCE STATUS: 1 - Symptomatic but completely ambulatory  Blood pressure (!) 177/73, pulse 74, temperature 98.1 F (36.7 C), temperature source Oral, resp. rate 18, height 5\' 3"  (1.6 m), weight 103 lb 8 oz (46.9 kg), SpO2 98 %.  LABORATORY DATA: Lab Results  Component Value Date   WBC 5.9 06/14/2022   HGB 12.6 06/14/2022   HCT 36.8 06/14/2022   MCV 96.8 06/14/2022   PLT 238 06/14/2022      Chemistry      Component Value Date/Time   NA 135 06/14/2022 1110   K 4.1 06/14/2022 1110   CL 99 06/14/2022 1110   CO2 28 06/14/2022 1110   BUN 10 06/14/2022 1110   CREATININE 0.69 06/14/2022 1110      Component Value Date/Time   CALCIUM 9.5 06/14/2022 1110   ALKPHOS 45 06/14/2022 1110   AST 18 06/14/2022 1110   ALT 13 06/14/2022 1110   BILITOT 0.8 06/14/2022 1110       RADIOGRAPHIC STUDIES: CT Chest W Contrast  Result Date: 06/15/2022 CLINICAL DATA:  Non-small-cell lung cancer. Restaging. * Tracking Code: BO * EXAM: CT CHEST WITH CONTRAST TECHNIQUE: Multidetector CT imaging of the chest was performed during intravenous contrast administration. RADIATION DOSE REDUCTION: This exam was performed according to the departmental dose-optimization program which includes automated exposure control, adjustment of the mA and/or kV according to patient size and/or use of iterative reconstruction technique. CONTRAST:  36mL OMNIPAQUE  IOHEXOL 300 MG/ML  SOLN COMPARISON:  12/13/2021 FINDINGS: Cardiovascular: The heart size is normal. No substantial pericardial effusion. Coronary artery calcification is evident. Moderate atherosclerotic calcification is noted in the wall of the thoracic aorta. Mediastinum/Nodes: No mediastinal lymphadenopathy. There is no hilar lymphadenopathy. The esophagus has normal imaging features. There is no axillary lymphadenopathy. Lungs/Pleura: Centrilobular emphsyema noted. Interval development of interstitial and consolidative airspace opacity in the posterior right upper lobe, obscuring the right upper lobe pulmonary nodule with biopsy/localization clip seen on the prior study. Imaging features suggest sequelae of radiation therapy. The more inferior right upper lobe pulmonary nodule is not discretely discernible today, having become obscured by a bandlike collection of soft tissue attenuation consistent with volume loss and radiation fibrosis. Tiny nodule seen anterior lingula on the previous study is stable on image 109/5 today. Linear atelectasis or scarring again noted right middle lobe and lingula. No new suspicious pulmonary nodule or mass. No pleural effusion. Upper Abdomen: Unremarkable. Musculoskeletal: Thoracolumbar fusion hardware has been incompletely visualized. No worrisome lytic or sclerotic osseous abnormality. IMPRESSION: 1. Interval development of interstitial and consolidative airspace opacity in the posterior right upper lobe, obscuring the right upper lobe pulmonary nodule with biopsy/localization clip  seen on the prior study. Imaging features suggest evolving sequelae of radiation therapy. 2. The more inferior right upper lobe pulmonary nodule is not discretely discernible today, having become obscured by a bandlike collection of soft tissue attenuation consistent with volume loss and evolving radiation fibrosis. 3. Stable tiny lingular nodule. 4. Aortic Atherosclerosis (ICD10-I70.0) and Emphysema  (ICD10-J43.9). Electronically Signed   By: Misty Stanley M.D.   On: 06/15/2022 15:58    ASSESSMENT AND PLAN: This is a very pleasant 76 years old white female diagnosed with a stage IA (T1b, N0, M0) non-small cell lung cancer presented with right upper lobe lung nodule status post SBRT under the care of Dr. Sondra Come completed in November 2022. The patient is currently on observation and she is feeling fine with no concerning complaints. She had repeat CT scan of the chest performed recently.  I personally and independently reviewed the scan images and discussed the result with the patient and her daughter. Her scan showed no concerning findings for disease recurrence or metastasis but she has evolving radiation changes in the area of the previous treatment. I recommended for her to continue on observation with repeat CT scan of the chest in 6 months. For the hypertension she was advised to monitor her blood pressure closely at home and to discuss with her primary care physician any need to change medication. The patient was advised to call immediately if she has any concerning symptoms in the interval.  The patient voices understanding of current disease status and treatment options and is in agreement with the current care plan.  All questions were answered. The patient knows to call the clinic with any problems, questions or concerns. We can certainly see the patient much sooner if necessary.  The total time spent in the appointment was 20 minutes.  Disclaimer: This note was dictated with voice recognition software. Similar sounding words can inadvertently be transcribed and may not be corrected upon review.

## 2022-06-19 DIAGNOSIS — R479 Unspecified speech disturbances: Secondary | ICD-10-CM | POA: Diagnosis not present

## 2022-06-19 DIAGNOSIS — R490 Dysphonia: Secondary | ICD-10-CM | POA: Diagnosis not present

## 2022-06-27 DIAGNOSIS — S81851A Open bite, right lower leg, initial encounter: Secondary | ICD-10-CM | POA: Diagnosis not present

## 2022-07-01 DIAGNOSIS — L03115 Cellulitis of right lower limb: Secondary | ICD-10-CM | POA: Diagnosis not present

## 2022-07-01 DIAGNOSIS — S81851D Open bite, right lower leg, subsequent encounter: Secondary | ICD-10-CM | POA: Diagnosis not present

## 2022-07-01 DIAGNOSIS — S81851A Open bite, right lower leg, initial encounter: Secondary | ICD-10-CM | POA: Diagnosis not present

## 2022-07-02 ENCOUNTER — Telehealth: Payer: Self-pay | Admitting: Internal Medicine

## 2022-07-02 NOTE — Telephone Encounter (Signed)
Called patient regarding upcoming 2024 appointments, left a voicemail.

## 2022-07-15 DIAGNOSIS — S63286A Dislocation of proximal interphalangeal joint of right little finger, initial encounter: Secondary | ICD-10-CM | POA: Diagnosis not present

## 2022-07-15 DIAGNOSIS — Y998 Other external cause status: Secondary | ICD-10-CM | POA: Diagnosis not present

## 2022-07-15 DIAGNOSIS — S62616A Displaced fracture of proximal phalanx of right little finger, initial encounter for closed fracture: Secondary | ICD-10-CM | POA: Diagnosis not present

## 2022-07-15 DIAGNOSIS — S63284A Dislocation of proximal interphalangeal joint of right ring finger, initial encounter: Secondary | ICD-10-CM | POA: Diagnosis not present

## 2022-07-15 DIAGNOSIS — W06XXXA Fall from bed, initial encounter: Secondary | ICD-10-CM | POA: Diagnosis not present

## 2022-07-16 DIAGNOSIS — S63286A Dislocation of proximal interphalangeal joint of right little finger, initial encounter: Secondary | ICD-10-CM | POA: Diagnosis not present

## 2022-07-16 DIAGNOSIS — S62616A Displaced fracture of proximal phalanx of right little finger, initial encounter for closed fracture: Secondary | ICD-10-CM | POA: Diagnosis not present

## 2022-07-16 DIAGNOSIS — S62604B Fracture of unspecified phalanx of right ring finger, initial encounter for open fracture: Secondary | ICD-10-CM | POA: Diagnosis not present

## 2022-07-16 DIAGNOSIS — M79641 Pain in right hand: Secondary | ICD-10-CM | POA: Diagnosis not present

## 2022-07-16 DIAGNOSIS — S63284A Dislocation of proximal interphalangeal joint of right ring finger, initial encounter: Secondary | ICD-10-CM | POA: Diagnosis not present

## 2022-07-16 DIAGNOSIS — Y998 Other external cause status: Secondary | ICD-10-CM | POA: Diagnosis not present

## 2022-07-16 DIAGNOSIS — W06XXXA Fall from bed, initial encounter: Secondary | ICD-10-CM | POA: Diagnosis not present

## 2022-07-22 DIAGNOSIS — M79641 Pain in right hand: Secondary | ICD-10-CM | POA: Diagnosis not present

## 2022-07-22 DIAGNOSIS — S63214D Subluxation of metacarpophalangeal joint of right ring finger, subsequent encounter: Secondary | ICD-10-CM | POA: Diagnosis not present

## 2022-07-29 DIAGNOSIS — S62609D Fracture of unspecified phalanx of unspecified finger, subsequent encounter for fracture with routine healing: Secondary | ICD-10-CM | POA: Diagnosis not present

## 2022-07-29 DIAGNOSIS — S62604D Fracture of unspecified phalanx of right ring finger, subsequent encounter for fracture with routine healing: Secondary | ICD-10-CM | POA: Diagnosis not present

## 2022-08-12 DIAGNOSIS — S62609D Fracture of unspecified phalanx of unspecified finger, subsequent encounter for fracture with routine healing: Secondary | ICD-10-CM | POA: Diagnosis not present

## 2022-08-12 DIAGNOSIS — M25441 Effusion, right hand: Secondary | ICD-10-CM | POA: Diagnosis not present

## 2022-08-12 DIAGNOSIS — S62604D Fracture of unspecified phalanx of right ring finger, subsequent encounter for fracture with routine healing: Secondary | ICD-10-CM | POA: Diagnosis not present

## 2022-08-26 DIAGNOSIS — S62609B Fracture of unspecified phalanx of unspecified finger, initial encounter for open fracture: Secondary | ICD-10-CM | POA: Diagnosis not present

## 2022-08-26 DIAGNOSIS — M25441 Effusion, right hand: Secondary | ICD-10-CM | POA: Diagnosis not present

## 2022-09-02 ENCOUNTER — Ambulatory Visit: Payer: Medicare Other | Admitting: Neurology

## 2022-09-02 DIAGNOSIS — S62609D Fracture of unspecified phalanx of unspecified finger, subsequent encounter for fracture with routine healing: Secondary | ICD-10-CM | POA: Diagnosis not present

## 2022-09-02 DIAGNOSIS — M25441 Effusion, right hand: Secondary | ICD-10-CM | POA: Diagnosis not present

## 2022-09-04 DIAGNOSIS — J3801 Paralysis of vocal cords and larynx, unilateral: Secondary | ICD-10-CM | POA: Diagnosis not present

## 2022-09-04 DIAGNOSIS — R490 Dysphonia: Secondary | ICD-10-CM | POA: Diagnosis not present

## 2022-09-04 DIAGNOSIS — Z91040 Latex allergy status: Secondary | ICD-10-CM | POA: Diagnosis not present

## 2022-09-04 DIAGNOSIS — R471 Dysarthria and anarthria: Secondary | ICD-10-CM | POA: Diagnosis not present

## 2022-09-09 DIAGNOSIS — M25441 Effusion, right hand: Secondary | ICD-10-CM | POA: Diagnosis not present

## 2022-09-09 DIAGNOSIS — S62609D Fracture of unspecified phalanx of unspecified finger, subsequent encounter for fracture with routine healing: Secondary | ICD-10-CM | POA: Diagnosis not present

## 2022-09-11 DIAGNOSIS — M25441 Effusion, right hand: Secondary | ICD-10-CM | POA: Diagnosis not present

## 2022-09-11 DIAGNOSIS — S62609D Fracture of unspecified phalanx of unspecified finger, subsequent encounter for fracture with routine healing: Secondary | ICD-10-CM | POA: Diagnosis not present

## 2022-09-16 DIAGNOSIS — S62609D Fracture of unspecified phalanx of unspecified finger, subsequent encounter for fracture with routine healing: Secondary | ICD-10-CM | POA: Diagnosis not present

## 2022-09-18 DIAGNOSIS — M25441 Effusion, right hand: Secondary | ICD-10-CM | POA: Diagnosis not present

## 2022-09-18 DIAGNOSIS — S62609D Fracture of unspecified phalanx of unspecified finger, subsequent encounter for fracture with routine healing: Secondary | ICD-10-CM | POA: Diagnosis not present

## 2022-09-23 DIAGNOSIS — M25441 Effusion, right hand: Secondary | ICD-10-CM | POA: Diagnosis not present

## 2022-09-23 DIAGNOSIS — S62609D Fracture of unspecified phalanx of unspecified finger, subsequent encounter for fracture with routine healing: Secondary | ICD-10-CM | POA: Diagnosis not present

## 2022-09-24 DIAGNOSIS — D3131 Benign neoplasm of right choroid: Secondary | ICD-10-CM | POA: Diagnosis not present

## 2022-09-24 DIAGNOSIS — Z961 Presence of intraocular lens: Secondary | ICD-10-CM | POA: Diagnosis not present

## 2022-09-24 DIAGNOSIS — H348322 Tributary (branch) retinal vein occlusion, left eye, stable: Secondary | ICD-10-CM | POA: Diagnosis not present

## 2022-09-24 DIAGNOSIS — H26491 Other secondary cataract, right eye: Secondary | ICD-10-CM | POA: Diagnosis not present

## 2022-09-25 DIAGNOSIS — M25441 Effusion, right hand: Secondary | ICD-10-CM | POA: Diagnosis not present

## 2022-09-25 DIAGNOSIS — S62609D Fracture of unspecified phalanx of unspecified finger, subsequent encounter for fracture with routine healing: Secondary | ICD-10-CM | POA: Diagnosis not present

## 2022-09-30 DIAGNOSIS — M25441 Effusion, right hand: Secondary | ICD-10-CM | POA: Diagnosis not present

## 2022-09-30 DIAGNOSIS — S62609D Fracture of unspecified phalanx of unspecified finger, subsequent encounter for fracture with routine healing: Secondary | ICD-10-CM | POA: Diagnosis not present

## 2022-10-02 DIAGNOSIS — S6991XA Unspecified injury of right wrist, hand and finger(s), initial encounter: Secondary | ICD-10-CM | POA: Diagnosis not present

## 2022-10-07 DIAGNOSIS — S62609D Fracture of unspecified phalanx of unspecified finger, subsequent encounter for fracture with routine healing: Secondary | ICD-10-CM | POA: Diagnosis not present

## 2022-10-07 DIAGNOSIS — M25441 Effusion, right hand: Secondary | ICD-10-CM | POA: Diagnosis not present

## 2022-10-09 DIAGNOSIS — M25441 Effusion, right hand: Secondary | ICD-10-CM | POA: Diagnosis not present

## 2022-10-09 DIAGNOSIS — S62609D Fracture of unspecified phalanx of unspecified finger, subsequent encounter for fracture with routine healing: Secondary | ICD-10-CM | POA: Diagnosis not present

## 2022-10-15 DIAGNOSIS — H524 Presbyopia: Secondary | ICD-10-CM | POA: Diagnosis not present

## 2022-10-16 DIAGNOSIS — M25641 Stiffness of right hand, not elsewhere classified: Secondary | ICD-10-CM | POA: Diagnosis not present

## 2022-10-16 DIAGNOSIS — S62609D Fracture of unspecified phalanx of unspecified finger, subsequent encounter for fracture with routine healing: Secondary | ICD-10-CM | POA: Diagnosis not present

## 2022-10-21 DIAGNOSIS — M25641 Stiffness of right hand, not elsewhere classified: Secondary | ICD-10-CM | POA: Diagnosis not present

## 2022-10-21 DIAGNOSIS — S62609D Fracture of unspecified phalanx of unspecified finger, subsequent encounter for fracture with routine healing: Secondary | ICD-10-CM | POA: Diagnosis not present

## 2022-10-23 DIAGNOSIS — S62609D Fracture of unspecified phalanx of unspecified finger, subsequent encounter for fracture with routine healing: Secondary | ICD-10-CM | POA: Diagnosis not present

## 2022-10-23 DIAGNOSIS — M25641 Stiffness of right hand, not elsewhere classified: Secondary | ICD-10-CM | POA: Diagnosis not present

## 2022-10-28 DIAGNOSIS — M25641 Stiffness of right hand, not elsewhere classified: Secondary | ICD-10-CM | POA: Diagnosis not present

## 2022-10-28 DIAGNOSIS — S62609D Fracture of unspecified phalanx of unspecified finger, subsequent encounter for fracture with routine healing: Secondary | ICD-10-CM | POA: Diagnosis not present

## 2022-10-30 DIAGNOSIS — M25641 Stiffness of right hand, not elsewhere classified: Secondary | ICD-10-CM | POA: Diagnosis not present

## 2022-10-31 IMAGING — MG MM DIGITAL SCREENING BILAT W/ TOMO AND CAD
6 of 10 series · 6 of 30 positions shown · non-contrast
Comparison: Previous exam(s).

CLINICAL DATA: Screening.

EXAM:
DIGITAL SCREENING BILATERAL MAMMOGRAM WITH TOMOSYNTHESIS AND CAD
TECHNIQUE: Bilateral screening digital craniocaudal and mediolateral oblique
mammograms were obtained. Bilateral screening digital breast
tomosynthesis was performed. The images were evaluated with
computer-aided detection.

[R CC synth-2D]
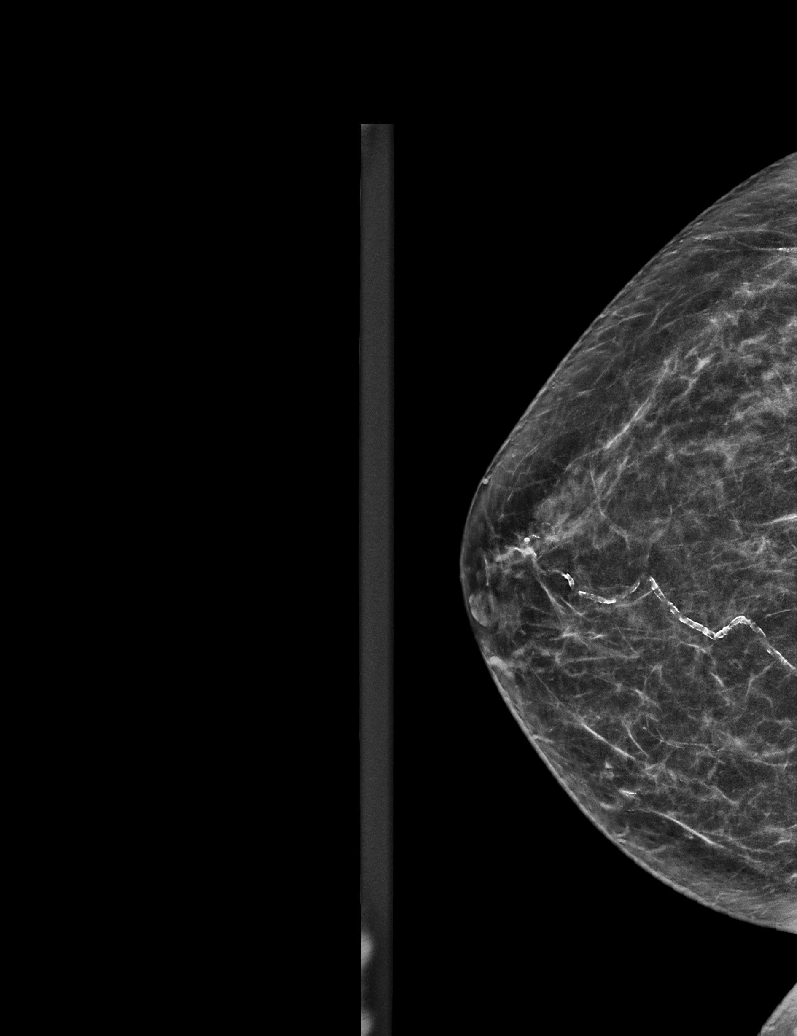

[L MLO synth-2D]
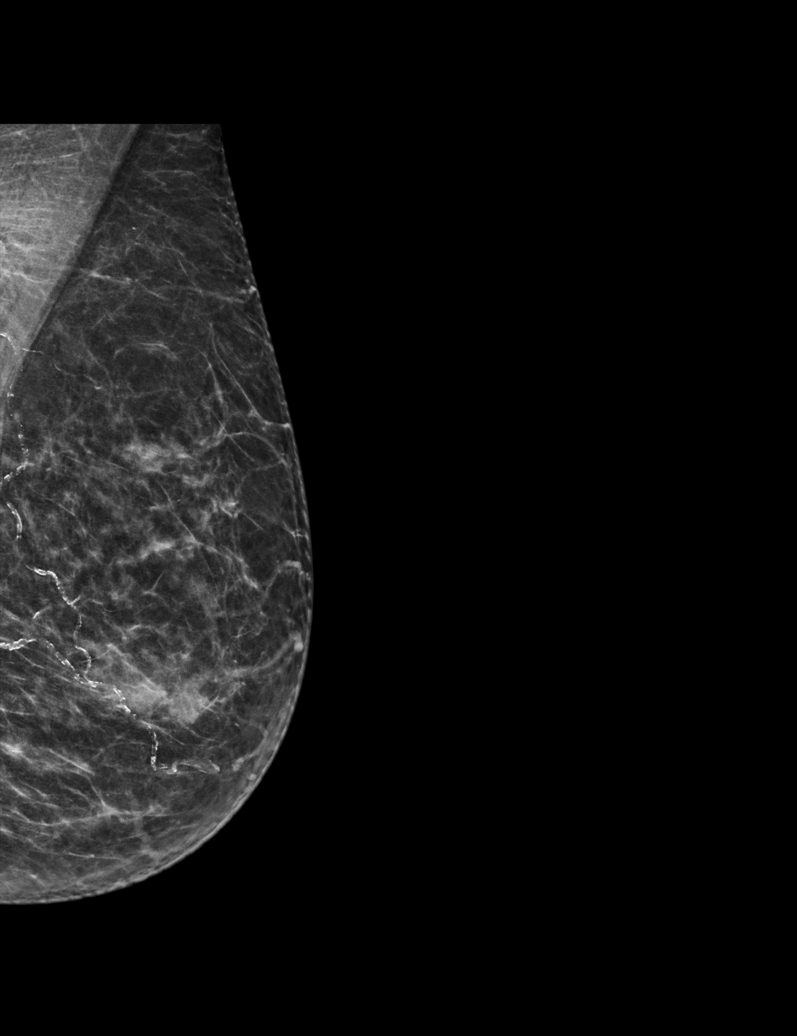

[L CC synth-2D (1 of 2)]
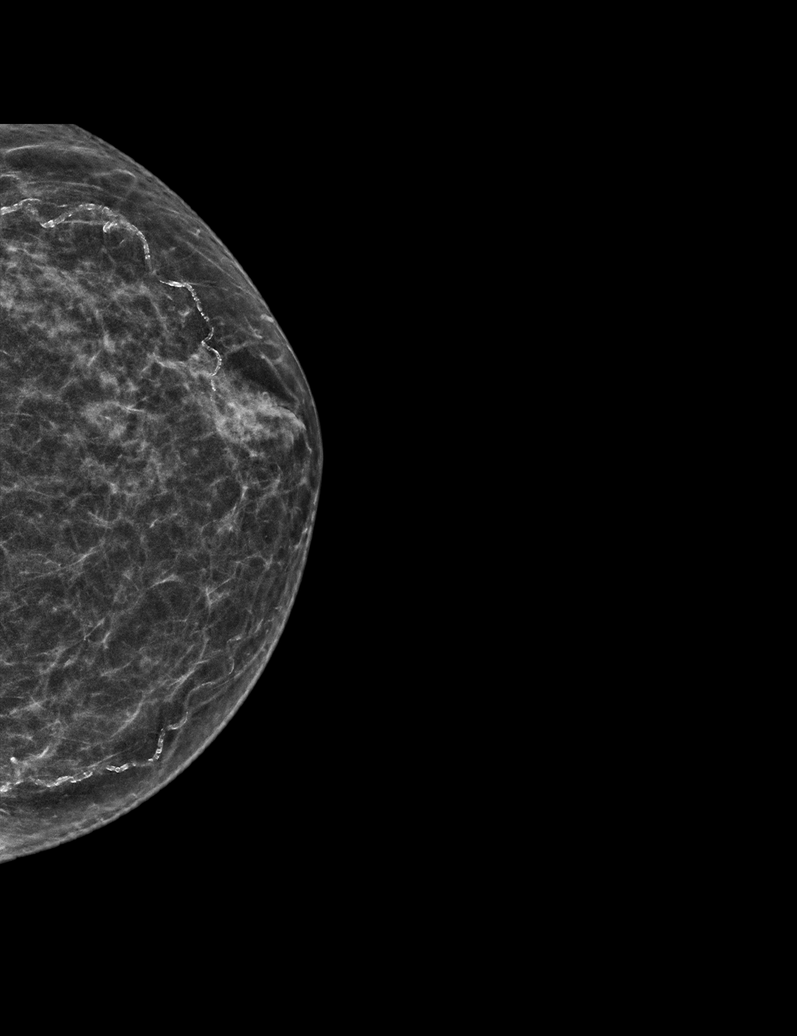

[R MLO synth-2D]
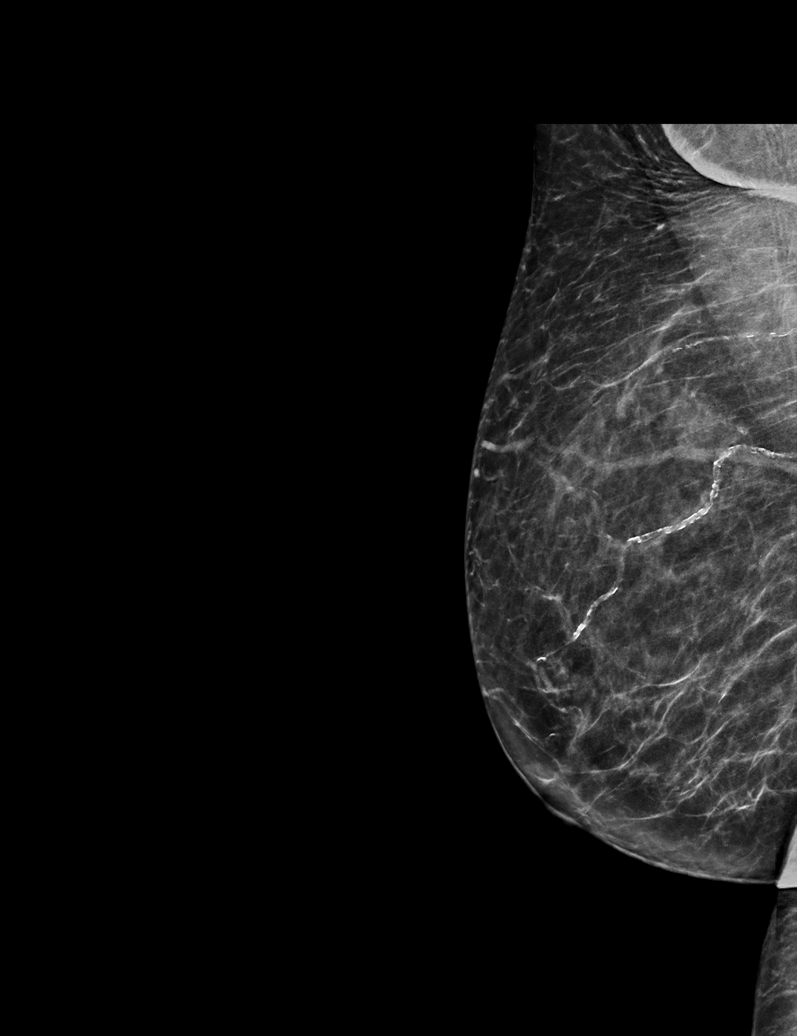

[L CC synth-2D (2 of 2)]
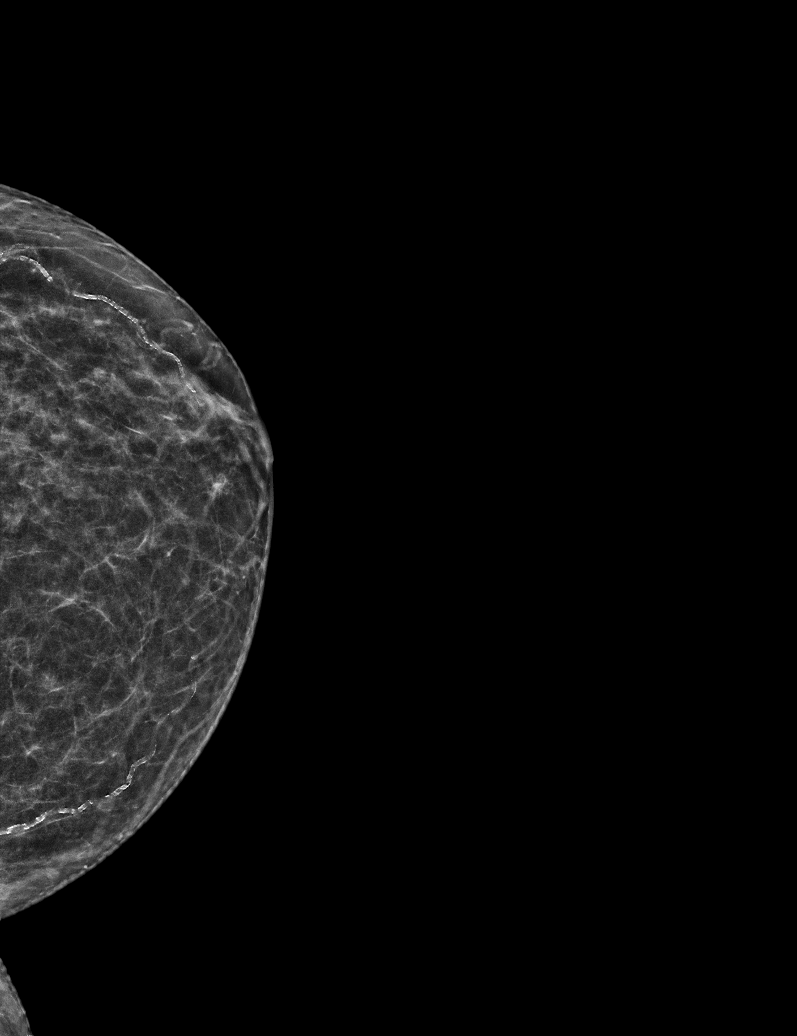

[R MLO tomo · tomo slice 31/61.0]
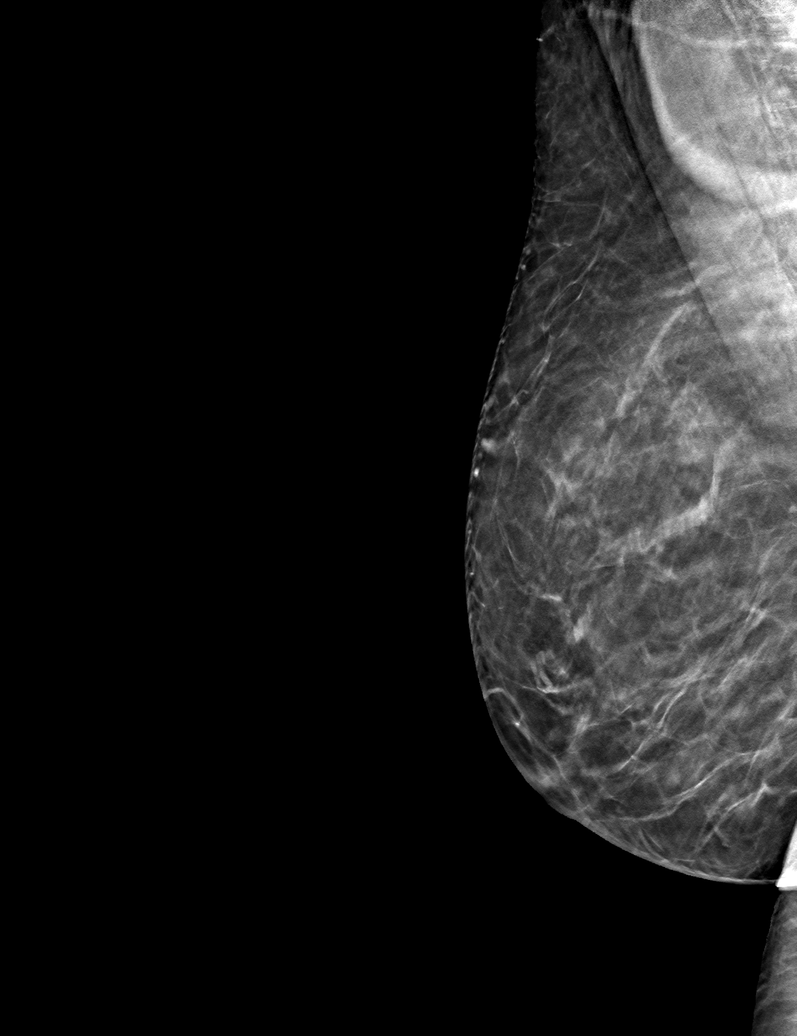

[6 of 30 positions shown; findings below may reference images not displayed]

ACR Breast Density Category b: There are scattered areas of
fibroglandular density.
FINDINGS: There are no findings suspicious for malignancy.
IMPRESSION: No mammographic evidence of malignancy. A result letter of this
screening mammogram will be mailed directly to the patient.

RECOMMENDATION:
Screening mammogram in one year. (Code:51-O-LD2)

BI-RADS CATEGORY  1: Negative.

## 2022-11-06 DIAGNOSIS — M25641 Stiffness of right hand, not elsewhere classified: Secondary | ICD-10-CM | POA: Diagnosis not present

## 2022-11-28 DIAGNOSIS — C3490 Malignant neoplasm of unspecified part of unspecified bronchus or lung: Secondary | ICD-10-CM | POA: Diagnosis not present

## 2022-11-28 DIAGNOSIS — E559 Vitamin D deficiency, unspecified: Secondary | ICD-10-CM | POA: Diagnosis not present

## 2022-11-28 DIAGNOSIS — E041 Nontoxic single thyroid nodule: Secondary | ICD-10-CM | POA: Diagnosis not present

## 2022-11-28 DIAGNOSIS — J439 Emphysema, unspecified: Secondary | ICD-10-CM | POA: Diagnosis not present

## 2022-11-28 DIAGNOSIS — E440 Moderate protein-calorie malnutrition: Secondary | ICD-10-CM | POA: Diagnosis not present

## 2022-11-28 DIAGNOSIS — Z23 Encounter for immunization: Secondary | ICD-10-CM | POA: Diagnosis not present

## 2022-11-28 DIAGNOSIS — Z0000 Encounter for general adult medical examination without abnormal findings: Secondary | ICD-10-CM | POA: Diagnosis not present

## 2022-11-28 DIAGNOSIS — Z87891 Personal history of nicotine dependence: Secondary | ICD-10-CM | POA: Diagnosis not present

## 2022-11-29 ENCOUNTER — Telehealth: Payer: Self-pay | Admitting: Physician Assistant

## 2022-11-29 NOTE — Telephone Encounter (Signed)
Called patient regarding upcoming February appointments, patient has been called and voicemail was left.

## 2022-11-29 NOTE — Telephone Encounter (Signed)
Patient's daughter in law called to change appointment time for earlier space. Adjusted and notified.

## 2022-12-04 DIAGNOSIS — S6991XA Unspecified injury of right wrist, hand and finger(s), initial encounter: Secondary | ICD-10-CM | POA: Diagnosis not present

## 2022-12-10 ENCOUNTER — Telehealth: Payer: Self-pay | Admitting: Internal Medicine

## 2022-12-10 NOTE — Telephone Encounter (Signed)
Called patient regarding upcoming February appointments, patient is notified.

## 2022-12-17 ENCOUNTER — Other Ambulatory Visit: Payer: Self-pay

## 2022-12-17 ENCOUNTER — Ambulatory Visit (HOSPITAL_COMMUNITY)
Admission: RE | Admit: 2022-12-17 | Discharge: 2022-12-17 | Disposition: A | Discharge status: Home / Self Care | Payer: Medicare Other | Source: Ambulatory Visit | Attending: Internal Medicine | Admitting: Internal Medicine

## 2022-12-17 ENCOUNTER — Inpatient Hospital Stay: Payer: Medicare Other | Attending: Physician Assistant

## 2022-12-17 ENCOUNTER — Encounter (HOSPITAL_COMMUNITY): Payer: Self-pay

## 2022-12-17 DIAGNOSIS — Z08 Encounter for follow-up examination after completed treatment for malignant neoplasm: Secondary | ICD-10-CM | POA: Insufficient documentation

## 2022-12-17 DIAGNOSIS — C3490 Malignant neoplasm of unspecified part of unspecified bronchus or lung: Secondary | ICD-10-CM | POA: Insufficient documentation

## 2022-12-17 DIAGNOSIS — R911 Solitary pulmonary nodule: Secondary | ICD-10-CM | POA: Diagnosis not present

## 2022-12-17 DIAGNOSIS — J439 Emphysema, unspecified: Secondary | ICD-10-CM | POA: Diagnosis not present

## 2022-12-17 DIAGNOSIS — Z85118 Personal history of other malignant neoplasm of bronchus and lung: Secondary | ICD-10-CM | POA: Diagnosis not present

## 2022-12-17 LAB — CMP (CANCER CENTER ONLY)
ALT: 14 U/L (ref 0–44)
AST: 19 U/L (ref 15–41)
Albumin: 4.5 g/dL (ref 3.5–5.0)
Alkaline Phosphatase: 64 U/L (ref 38–126)
Anion gap: 8 (ref 5–15)
BUN: 14 mg/dL (ref 8–23)
CO2: 28 mmol/L (ref 22–32)
Calcium: 10.1 mg/dL (ref 8.9–10.3)
Chloride: 101 mmol/L (ref 98–111)
Creatinine: 0.67 mg/dL (ref 0.44–1.00)
GFR, Estimated: >60 mL/min (ref >60)
Glucose, Bld: 91 mg/dL (ref 70–99)
Potassium: 3.9 mmol/L (ref 3.5–5.1)
Sodium: 137 mmol/L (ref 135–145)
Total Bilirubin: 0.7 mg/dL (ref 0.3–1.2)
Total Protein: 7.4 g/dL (ref 6.5–8.1)

## 2022-12-17 LAB — CBC WITH DIFFERENTIAL (CANCER CENTER ONLY)
Abs Immature Granulocytes: 0.01 10*3/uL (ref 0.00–0.07)
Basophils Absolute: 0 10*3/uL (ref 0.0–0.1)
Basophils Relative: 0 %
Eosinophils Absolute: 0.1 10*3/uL (ref 0.0–0.5)
Eosinophils Relative: 1 %
HCT: 35.3 % — ABNORMAL LOW (ref 36.0–46.0)
Hemoglobin: 12.4 g/dL (ref 12.0–15.0)
Immature Granulocytes: 0 %
Lymphocytes Relative: 24 %
Lymphs Abs: 1.7 10*3/uL (ref 0.7–4.0)
MCH: 34.4 pg — ABNORMAL HIGH (ref 26.0–34.0)
MCHC: 35.1 g/dL (ref 30.0–36.0)
MCV: 98.1 fL (ref 80.0–100.0)
Monocytes Absolute: 0.6 10*3/uL (ref 0.1–1.0)
Monocytes Relative: 8 %
Neutro Abs: 4.7 10*3/uL (ref 1.7–7.7)
Neutrophils Relative %: 67 %
Platelet Count: 263 10*3/uL (ref 150–400)
RBC: 3.6 MIL/uL — ABNORMAL LOW (ref 3.87–5.11)
RDW: 13.1 % (ref 11.5–15.5)
WBC Count: 7.1 10*3/uL (ref 4.0–10.5)
nRBC: 0 % (ref 0.0–0.2)

## 2022-12-17 MED ORDER — SODIUM CHLORIDE (PF) 0.9 % IJ SOLN
INTRAMUSCULAR | Status: AC
  Filled 2022-12-17: qty 50

## 2022-12-17 MED ORDER — IOHEXOL 300 MG/ML  SOLN
75.0000 mL | Freq: Once | INTRAMUSCULAR | Status: AC | PRN
  Administered 2022-12-17: 75 mL via INTRAVENOUS

## 2022-12-19 ENCOUNTER — Ambulatory Visit: Payer: Medicare Other | Admitting: Physician Assistant

## 2022-12-19 ENCOUNTER — Ambulatory Visit: Payer: Medicare Other | Admitting: Internal Medicine

## 2022-12-23 ENCOUNTER — Ambulatory Visit: Payer: Medicare Other | Admitting: Physician Assistant

## 2022-12-24 ENCOUNTER — Ambulatory Visit: Payer: Self-pay | Admitting: Physician Assistant

## 2023-01-03 NOTE — Progress Notes (Unsigned)
[facility] OFFICE PROGRESS NOTE  Faustino Congress, NP [place] [address]  DIAGNOSIS: Stage IA (T1b, N0, M0) non-small cell lung cancer presented with right upper lobe lung nodule that biopsy confirmed in September 2022.   PRIOR THERAPY: SBRT to the right upper lobe lung nodule under the care of Dr. Sondra Come completed on September 17, 2021   CURRENT THERAPY: Observation.   INTERVAL HISTORY: Virginia Rose 77 y.o. female returns to the clinic today for 37-monthfollow-up visit.  The patient was last seen by Dr. MJulien Nordmannon 06/18/2022.  The patient currently on observation for history of a stage Ia non-small cell lung cancer that was diagnosed in September 2022.  She underwent SBRT to this area and is feeling well at this time.  Today she denies any fevers, chills, night sweats, or unexplained weight loss.  Mild cough and shortness of breath with exertion but denies any chest pain or hemoptysis.  Denies any nausea, vomiting, diarrhea, or constipation.  Denies any headache or visual changes.  She recently had a restaging CT scan performed.  She is here today for evaluation to review her scan results  MEDICAL HISTORY: Past Medical History:  Diagnosis Date   Arthritis    Complication of anesthesia    COPD [place] disease) [place]    History of blood transfusion    with back surgery procedure   History of radiation therapy    right lung 09/10/2021, 09/12/2021, 09/17/2021  Dr JGery Pray  Hypercholesteremia    Memory loss    Osteoporosis    Shortness of breath    with exertion, no oxygen   Smoker    quit 05/14/21  - 1ppd - pt started smoking at the age [id number]    ALLERGIES:  is allergic to latex.  MEDICATIONS:  Current Outpatient Medications  Medication Sig Dispense Refill   brimonidine (ALPHAGAN) 0.2 % ophthalmic solution Place 1 drop into both eyes 2 (two) times daily.     CALCIUM-VITAMIN D PO Take 2 tablets by mouth daily.      dorzolamide (TRUSOPT) 2 % ophthalmic solution Place 1 drop into both eyes 2 (two) times daily.     No current facility-administered medications for this visit.    SURGICAL HISTORY:  Past Surgical History:  Procedure Laterality Date   ABDOMINAL HYSTERECTOMY     APPENDECTOMY     BRONCHIAL BIOPSY  07/31/2021   Procedure: BRONCHIAL BIOPSIES;  Surgeon: IcGarner NashDO;  [facility];  Service: Pulmonary;;   BRONCHIAL BRUSHINGS  07/31/2021   Procedure: BRONCHIAL BRUSHINGS;  Surgeon: IcGarner NashDO;  [facility];;   BRONCHIAL NEEDLE ASPIRATION BIOPSY  07/31/2021   Procedure: BRONCHIAL NEEDLE ASPIRATION BIOPSIES;  Surgeon: IcGarner NashDO;  [facility];;   BRONCHIAL WASHINGS  07/31/2021   Procedure: BRONCHIAL WASHINGS;  Surgeon: IcGarner NashDO;  [facility];  Service: Pulmonary;;   COLONOSCOPY W/ POLYPECTOMY     EYE SURGERY     bilateral cataract removal   FIDUCIAL MARKER PLACEMENT  07/31/2021   Procedure: FIDUCIAL MARKER PLACEMENT;  Surgeon: IcGarner NashDO;  [facility];  Service: Pulmonary;;   POSTERIOR LUMBAR FUSION 4 LEVEL N/A 08/03/2014   Procedure: THORACIC TEN-ELEVEN,THORACIC ELEVEN-TWELVE,THORACIC TWELVE-LUMBAR ONE,LUMBARTWO-THREE,LUMBAR THREE-FOUR POSTERIOR LATERAL FUSION;  Surgeon: GaElaina HoopsMD;  [facility];  Service: Neurosurgery;  Laterality: N/A;  Site includes thoracic and lumbar  spine   VIDEO BRONCHOSCOPY WITH ENDOBRONCHIAL NAVIGATION N/A 07/31/2021   Procedure: VIDEO BRONCHOSCOPY WITH ENDOBRONCHIAL NAVIGATION;  Surgeon: Garner Nash, DO;  Location: [facility];  Service: Pulmonary;  Laterality: N/A;  ION   VIDEO BRONCHOSCOPY WITH RADIAL ENDOBRONCHIAL ULTRASOUND  07/31/2021   Procedure: RADIAL ENDOBRONCHIAL ULTRASOUND;  Surgeon: Garner Nash, DO;  Location: [facility];  Service: Pulmonary;;    REVIEW OF SYSTEMS:   Review of Systems   Constitutional: Negative for appetite change, chills, fatigue, fever and unexpected weight change.  HENT:   Negative for mouth sores, nosebleeds, sore throat and trouble swallowing.   Eyes: Negative for eye problems and icterus.  Respiratory: Negative for cough, hemoptysis, shortness of breath and wheezing.   Cardiovascular: Negative for chest pain and leg swelling.  Gastrointestinal: Negative for abdominal pain, constipation, diarrhea, nausea and vomiting.  Genitourinary: Negative for bladder incontinence, difficulty urinating, dysuria, frequency and hematuria.   Musculoskeletal: Negative for back pain, gait problem, neck pain and neck stiffness.  Skin: Negative for itching and rash.  Neurological: Negative for dizziness, extremity weakness, gait problem, headaches, light-headedness and seizures.  Hematological: Negative for adenopathy. Does not bruise/bleed easily.  Psychiatric/Behavioral: Negative for confusion, depression and sleep disturbance. The patient is not nervous/anxious.     PHYSICAL EXAMINATION:  There were no vitals taken for this visit.  ECOG PERFORMANCE STATUS: {CHL ONC ECOG [id number]  Physical Exam  Constitutional: Oriented to person, place, and time and well-developed, well-nourished, and in no distress. No distress.  HENT:  Head: Normocephalic and atraumatic.  Mouth/Throat: Oropharynx is clear and moist. No oropharyngeal exudate.  Eyes: Conjunctivae are normal. Right eye exhibits no discharge. Left eye exhibits no discharge. No scleral icterus.  Neck: Normal range of motion. Neck supple.  Cardiovascular: Normal rate, regular rhythm, normal heart sounds and intact distal pulses.   Pulmonary/Chest: Effort normal and breath sounds normal. No respiratory distress. No wheezes. No rales.  Abdominal: Soft. Bowel sounds are normal. Exhibits no distension and no mass. There is no tenderness.  Musculoskeletal: Normal range of motion. Exhibits no edema.  Lymphadenopathy:     No cervical adenopathy.  Neurological: Alert and oriented to person, place, and time. Exhibits normal muscle tone. Gait normal. Coordination normal.  Skin: Skin is warm and dry. No rash noted. Not diaphoretic. No erythema. No pallor.  Psychiatric: Mood, memory and judgment normal.  Vitals reviewed.  LABORATORY DATA: Lab Results  Component Value Date   WBC 7.1 12/17/2022   HGB 12.4 12/17/2022   HCT 35.3 (L) 12/17/2022   MCV 98.1 12/17/2022   PLT 263 12/17/2022      Chemistry      Component Value Date/Time   NA 137 12/17/2022 1040   K 3.9 12/17/2022 1040   CL 101 12/17/2022 1040   CO2 28 12/17/2022 1040   BUN 14 12/17/2022 1040   CREATININE 0.67 12/17/2022 1040      Component Value Date/Time   CALCIUM 10.1 12/17/2022 1040   ALKPHOS 64 12/17/2022 1040   AST 19 12/17/2022 1040   ALT 14 12/17/2022 1040   BILITOT 0.7 12/17/2022 1040       RADIOGRAPHIC STUDIES:  CT Chest W Contrast  Result Date: 12/17/2022 CLINICAL DATA:  Follow-up non-small cell lung cancer, staging EXAM: CT CHEST WITH CONTRAST TECHNIQUE: Multidetector CT imaging of the chest was performed during intravenous contrast administration. RADIATION DOSE REDUCTION: This exam was performed according to the departmental dose-optimization program which includes automated exposure control, adjustment of the mA and/or kV according  to patient size and/or use of iterative reconstruction technique. CONTRAST:  27m OMNIPAQUE IOHEXOL 300 MG/ML  SOLN COMPARISON:  06/18/2022 and 12/13/2021 FINDINGS: Cardiovascular: The heart is normal in size. No pericardial effusion. Mitral valve annular calcifications. No evidence of thoracic aortic aneurysm. Atherosclerotic calcifications of the aortic arch. Mediastinum/Nodes: No suspicious mediastinal lymphadenopathy. Visualized thyroid is unremarkable. Lungs/Pleura: Radiation changes in the right upper lobe (series 5/image 30), improved from recent prior. Underlying residual 11 mm nodule,  obscured on the most recent study but improved from February 2023, with adjacent fiducial marker. Moderate centrilobular and paraseptal emphysematous changes. No new/suspicious pulmonary nodules. No focal consolidation. No pleural effusion or pneumothorax. Upper Abdomen: Visualized upper abdomen is grossly unremarkable, noting vascular calcifications. Musculoskeletal: Severe compression fracture deformity at L1 with prior lower thoracic/upper lumbar spine fixation hardware. Mild superior endplate compression fracture deformity at T8, unchanged. IMPRESSION: Radiation changes in the right upper lobe. Underlying residual 11 mm nodule, obscured on the most recent study but improved from February 2023. No evidence of metastatic disease in the chest. Aortic Atherosclerosis (ICD10-I70.0) and Emphysema (ICD10-J43.9). Electronically Signed   By: SJulian HyM.D.   On: 12/17/2022 15:17     ASSESSMENT/PLAN:  This is a very pleasant 77year old Caucasian female with a history of stage Ia (T1b, N0, M0) non-small cell lung cancer.  She presented with a right upper lobe lung nodule and is status post SBRT under the care of Dr. KSondra Comewhich was completed in November 2022.  She has been on observation since that time and feeling fairly well.  The patient recently had a restaging CT scan performed.  Dr. MJulien Nordmannpersonally independently reviewed the scan and discussed the results with the patient today.  The scan did not show any evidence of disease progression.  Dr. MJulien Nordmannrecommends the patient continue on observation with a restaging CT scan of the chest in 1 year?  The patient was advised to call immediately if she has any concerning symptoms in the interval. The patient voices understanding of current disease status and treatment options and is in agreement with the current care plan. All questions were answered. The patient knows to call the clinic with any problems, questions or concerns. We can certainly  see the patient much sooner if necessary   No orders of the defined types were placed in this encounter.    I spent {CHL ONC TIME VISIT - SWR:[id number] the patient face to face. The total time spent in the appointment was {CHL ONC TIME VISIT - SWR:[id number]  Gordie Crumby L Mishell Donalson, PA-C 01/03/23

## 2023-01-07 ENCOUNTER — Inpatient Hospital Stay: Payer: Medicare Other | Admitting: Physician Assistant

## 2023-01-07 ENCOUNTER — Other Ambulatory Visit: Payer: Self-pay

## 2023-01-07 VITALS — BP 184/84 | HR 83 | Temp 97.8°F | Resp 15 | Ht 63.0 in | Wt 105.3 lb

## 2023-01-07 DIAGNOSIS — C3411 Malignant neoplasm of upper lobe, right bronchus or lung: Secondary | ICD-10-CM

## 2023-01-07 DIAGNOSIS — Z08 Encounter for follow-up examination after completed treatment for malignant neoplasm: Secondary | ICD-10-CM | POA: Diagnosis not present

## 2023-01-07 DIAGNOSIS — Z85118 Personal history of other malignant neoplasm of bronchus and lung: Secondary | ICD-10-CM | POA: Diagnosis not present

## 2023-01-08 NOTE — Progress Notes (Signed)
This nurse faxed referral last office notes, labs and recent CT report  to: Dr. Maylon Peppers at [facility] in [place].  Phone: [phone] Fax [phone].  No further concerns noted at this time.

## 2023-01-10 DIAGNOSIS — J432 Centrilobular emphysema: Secondary | ICD-10-CM | POA: Diagnosis not present

## 2023-01-10 DIAGNOSIS — C3411 Malignant neoplasm of upper lobe, right bronchus or lung: Secondary | ICD-10-CM | POA: Diagnosis not present

## 2023-01-10 DIAGNOSIS — Z87891 Personal history of nicotine dependence: Secondary | ICD-10-CM | POA: Diagnosis not present

## 2023-01-13 DIAGNOSIS — J432 Centrilobular emphysema: Secondary | ICD-10-CM | POA: Diagnosis not present

## 2023-01-13 DIAGNOSIS — R0600 Dyspnea, unspecified: Secondary | ICD-10-CM | POA: Diagnosis not present

## 2023-01-14 DIAGNOSIS — J432 Centrilobular emphysema: Secondary | ICD-10-CM | POA: Diagnosis not present

## 2023-01-14 DIAGNOSIS — Z87891 Personal history of nicotine dependence: Secondary | ICD-10-CM | POA: Diagnosis not present

## 2023-01-30 DIAGNOSIS — Z7951 Long term (current) use of inhaled steroids: Secondary | ICD-10-CM | POA: Diagnosis not present

## 2023-01-30 DIAGNOSIS — J449 Chronic obstructive pulmonary disease, unspecified: Secondary | ICD-10-CM | POA: Diagnosis not present

## 2023-01-30 DIAGNOSIS — Z87891 Personal history of nicotine dependence: Secondary | ICD-10-CM | POA: Diagnosis not present

## 2023-02-26 DIAGNOSIS — R1310 Dysphagia, unspecified: Secondary | ICD-10-CM | POA: Diagnosis not present

## 2023-02-26 DIAGNOSIS — R531 Weakness: Secondary | ICD-10-CM | POA: Diagnosis not present

## 2023-02-26 DIAGNOSIS — R471 Dysarthria and anarthria: Secondary | ICD-10-CM | POA: Diagnosis not present

## 2023-02-27 DIAGNOSIS — R499 Unspecified voice and resonance disorder: Secondary | ICD-10-CM | POA: Diagnosis not present

## 2023-02-27 DIAGNOSIS — R2689 Other abnormalities of gait and mobility: Secondary | ICD-10-CM | POA: Diagnosis not present

## 2023-02-27 DIAGNOSIS — E782 Mixed hyperlipidemia: Secondary | ICD-10-CM | POA: Diagnosis not present

## 2023-02-27 DIAGNOSIS — E559 Vitamin D deficiency, unspecified: Secondary | ICD-10-CM | POA: Diagnosis not present

## 2023-03-12 DIAGNOSIS — R499 Unspecified voice and resonance disorder: Secondary | ICD-10-CM | POA: Diagnosis not present

## 2023-03-12 DIAGNOSIS — R2689 Other abnormalities of gait and mobility: Secondary | ICD-10-CM | POA: Diagnosis not present

## 2023-05-16 DIAGNOSIS — H348322 Tributary (branch) retinal vein occlusion, left eye, stable: Secondary | ICD-10-CM | POA: Diagnosis not present

## 2023-05-16 DIAGNOSIS — D3131 Benign neoplasm of right choroid: Secondary | ICD-10-CM | POA: Diagnosis not present

## 2023-05-16 DIAGNOSIS — H40003 Preglaucoma, unspecified, bilateral: Secondary | ICD-10-CM | POA: Diagnosis not present

## 2023-05-28 DIAGNOSIS — R471 Dysarthria and anarthria: Secondary | ICD-10-CM | POA: Diagnosis not present

## 2023-05-28 DIAGNOSIS — R1310 Dysphagia, unspecified: Secondary | ICD-10-CM | POA: Diagnosis not present

## 2023-06-02 DIAGNOSIS — J449 Chronic obstructive pulmonary disease, unspecified: Secondary | ICD-10-CM | POA: Diagnosis not present

## 2023-06-02 DIAGNOSIS — Z7951 Long term (current) use of inhaled steroids: Secondary | ICD-10-CM | POA: Diagnosis not present

## 2023-06-19 DIAGNOSIS — R471 Dysarthria and anarthria: Secondary | ICD-10-CM | POA: Diagnosis not present

## 2023-06-19 DIAGNOSIS — Z923 Personal history of irradiation: Secondary | ICD-10-CM | POA: Diagnosis not present

## 2023-06-19 DIAGNOSIS — C3491 Malignant neoplasm of unspecified part of right bronchus or lung: Secondary | ICD-10-CM | POA: Diagnosis not present

## 2023-06-19 DIAGNOSIS — Z85118 Personal history of other malignant neoplasm of bronchus and lung: Secondary | ICD-10-CM | POA: Diagnosis not present

## 2023-06-19 DIAGNOSIS — Z08 Encounter for follow-up examination after completed treatment for malignant neoplasm: Secondary | ICD-10-CM | POA: Diagnosis not present

## 2023-08-01 DIAGNOSIS — J439 Emphysema, unspecified: Secondary | ICD-10-CM | POA: Diagnosis not present

## 2023-08-01 DIAGNOSIS — C3490 Malignant neoplasm of unspecified part of unspecified bronchus or lung: Secondary | ICD-10-CM | POA: Diagnosis not present

## 2023-08-01 DIAGNOSIS — R918 Other nonspecific abnormal finding of lung field: Secondary | ICD-10-CM | POA: Diagnosis not present

## 2023-08-01 DIAGNOSIS — C3491 Malignant neoplasm of unspecified part of right bronchus or lung: Secondary | ICD-10-CM | POA: Diagnosis not present

## 2023-08-26 DIAGNOSIS — Z85118 Personal history of other malignant neoplasm of bronchus and lung: Secondary | ICD-10-CM | POA: Diagnosis not present

## 2023-08-26 DIAGNOSIS — M79641 Pain in right hand: Secondary | ICD-10-CM | POA: Diagnosis not present

## 2023-08-26 DIAGNOSIS — W07XXXA Fall from chair, initial encounter: Secondary | ICD-10-CM | POA: Diagnosis not present

## 2023-08-26 DIAGNOSIS — S62612A Displaced fracture of proximal phalanx of right middle finger, initial encounter for closed fracture: Secondary | ICD-10-CM | POA: Diagnosis not present

## 2023-08-26 DIAGNOSIS — S0511XA Contusion of eyeball and orbital tissues, right eye, initial encounter: Secondary | ICD-10-CM | POA: Diagnosis not present

## 2023-08-26 DIAGNOSIS — R030 Elevated blood-pressure reading, without diagnosis of hypertension: Secondary | ICD-10-CM | POA: Diagnosis not present

## 2023-08-26 DIAGNOSIS — Z043 Encounter for examination and observation following other accident: Secondary | ICD-10-CM | POA: Diagnosis not present

## 2023-08-26 DIAGNOSIS — M19031 Primary osteoarthritis, right wrist: Secondary | ICD-10-CM | POA: Diagnosis not present

## 2023-08-26 DIAGNOSIS — S0990XA Unspecified injury of head, initial encounter: Secondary | ICD-10-CM | POA: Diagnosis not present

## 2023-08-26 DIAGNOSIS — Z87891 Personal history of nicotine dependence: Secondary | ICD-10-CM | POA: Diagnosis not present

## 2023-08-26 DIAGNOSIS — M5412 Radiculopathy, cervical region: Secondary | ICD-10-CM | POA: Diagnosis not present

## 2023-08-26 DIAGNOSIS — M25511 Pain in right shoulder: Secondary | ICD-10-CM | POA: Diagnosis not present

## 2023-08-26 DIAGNOSIS — S199XXA Unspecified injury of neck, initial encounter: Secondary | ICD-10-CM | POA: Diagnosis not present

## 2023-08-26 DIAGNOSIS — J449 Chronic obstructive pulmonary disease, unspecified: Secondary | ICD-10-CM | POA: Diagnosis not present

## 2023-08-26 DIAGNOSIS — M8588 Other specified disorders of bone density and structure, other site: Secondary | ICD-10-CM | POA: Diagnosis not present

## 2023-08-26 DIAGNOSIS — E785 Hyperlipidemia, unspecified: Secondary | ICD-10-CM | POA: Diagnosis not present

## 2023-08-26 DIAGNOSIS — M542 Cervicalgia: Secondary | ICD-10-CM | POA: Diagnosis not present

## 2023-08-26 DIAGNOSIS — M25561 Pain in right knee: Secondary | ICD-10-CM | POA: Diagnosis not present

## 2023-08-26 DIAGNOSIS — S6291XA Unspecified fracture of right wrist and hand, initial encounter for closed fracture: Secondary | ICD-10-CM | POA: Diagnosis not present

## 2023-08-26 DIAGNOSIS — W010XXA Fall on same level from slipping, tripping and stumbling without subsequent striking against object, initial encounter: Secondary | ICD-10-CM | POA: Diagnosis not present

## 2023-08-26 DIAGNOSIS — M19041 Primary osteoarthritis, right hand: Secondary | ICD-10-CM | POA: Diagnosis not present

## 2023-08-26 DIAGNOSIS — Z79899 Other long term (current) drug therapy: Secondary | ICD-10-CM | POA: Diagnosis not present

## 2023-09-27 DIAGNOSIS — M25532 Pain in left wrist: Secondary | ICD-10-CM | POA: Diagnosis not present

## 2023-09-27 DIAGNOSIS — S52552A Other extraarticular fracture of lower end of left radius, initial encounter for closed fracture: Secondary | ICD-10-CM | POA: Diagnosis not present

## 2023-10-03 DIAGNOSIS — S52572A Other intraarticular fracture of lower end of left radius, initial encounter for closed fracture: Secondary | ICD-10-CM | POA: Diagnosis not present

## 2023-10-03 DIAGNOSIS — S6982XA Other specified injuries of left wrist, hand and finger(s), initial encounter: Secondary | ICD-10-CM | POA: Diagnosis not present

## 2023-10-31 DIAGNOSIS — S52572A Other intraarticular fracture of lower end of left radius, initial encounter for closed fracture: Secondary | ICD-10-CM | POA: Diagnosis not present
